# Patient Record
Sex: Male | Born: 1990 | Race: White | Hispanic: No | Marital: Single | Smoking: Former smoker
Health system: Southern US, Community
[De-identification: ages and names within clinical notes are randomized; demographics above are authoritative.]

## PROBLEM LIST (undated history)

## (undated) DIAGNOSIS — R569 Unspecified convulsions: Secondary | ICD-10-CM

## (undated) DIAGNOSIS — F8081 Childhood onset fluency disorder: Secondary | ICD-10-CM

## (undated) HISTORY — PX: NO PAST SURGERIES: SHX2092

---

## 2015-09-19 ENCOUNTER — Encounter (HOSPITAL_COMMUNITY): Payer: Self-pay | Admitting: Nurse Practitioner

## 2015-09-19 ENCOUNTER — Emergency Department (HOSPITAL_COMMUNITY)
Admission: EM | Admit: 2015-09-19 | Discharge: 2015-09-20 | Disposition: A | Discharge status: Home / Self Care | Payer: Self-pay | Attending: Emergency Medicine | Admitting: Emergency Medicine

## 2015-09-19 DIAGNOSIS — R1012 Left upper quadrant pain: Secondary | ICD-10-CM | POA: Insufficient documentation

## 2015-09-19 DIAGNOSIS — E860 Dehydration: Secondary | ICD-10-CM

## 2015-09-19 DIAGNOSIS — F17210 Nicotine dependence, cigarettes, uncomplicated: Secondary | ICD-10-CM | POA: Insufficient documentation

## 2015-09-19 HISTORY — DX: Unspecified convulsions: R56.9

## 2015-09-19 HISTORY — DX: Childhood onset fluency disorder: F80.81

## 2015-09-19 LAB — I-STAT CHEM 8, ED
BUN: 14 mg/dL (ref 6–20)
CALCIUM ION: 1.15 mmol/L (ref 1.12–1.23)
Chloride: 101 mmol/L (ref 101–111)
Creatinine, Ser: 0.9 mg/dL (ref 0.61–1.24)
Glucose, Bld: 100 mg/dL — ABNORMAL HIGH (ref 65–99)
HEMATOCRIT: 43 % (ref 39.0–52.0)
HEMOGLOBIN: 14.6 g/dL (ref 13.0–17.0)
Potassium: 4 mmol/L (ref 3.5–5.1)
SODIUM: 138 mmol/L (ref 135–145)
TCO2: 22 mmol/L (ref 0–100)

## 2015-09-19 LAB — CBC
HCT: 44.4 % (ref 39.0–52.0)
HEMOGLOBIN: 15.1 g/dL (ref 13.0–17.0)
MCH: 29.3 pg (ref 26.0–34.0)
MCHC: 34 g/dL (ref 30.0–36.0)
MCV: 86 fL (ref 78.0–100.0)
PLATELETS: 214 10*3/uL (ref 150–400)
RBC: 5.16 MIL/uL (ref 4.22–5.81)
RDW: 12.5 % (ref 11.5–15.5)
WBC: 16.1 10*3/uL — ABNORMAL HIGH (ref 4.0–10.5)

## 2015-09-19 LAB — URINALYSIS, ROUTINE W REFLEX MICROSCOPIC
BILIRUBIN URINE: NEGATIVE
Glucose, UA: NEGATIVE mg/dL
HGB URINE DIPSTICK: NEGATIVE
Ketones, ur: 40 mg/dL — AB
Leukocytes, UA: NEGATIVE
NITRITE: NEGATIVE
PROTEIN: NEGATIVE mg/dL
Specific Gravity, Urine: 1.016 (ref 1.005–1.030)
pH: 5.5 (ref 5.0–8.0)

## 2015-09-19 LAB — COMPREHENSIVE METABOLIC PANEL
ALT: 38 U/L (ref 17–63)
ANION GAP: 12 (ref 5–15)
AST: 30 U/L (ref 15–41)
Albumin: 5.2 g/dL — ABNORMAL HIGH (ref 3.5–5.0)
Alkaline Phosphatase: 70 U/L (ref 38–126)
BILIRUBIN TOTAL: 1.6 mg/dL — AB (ref 0.3–1.2)
BUN: 15 mg/dL (ref 6–20)
CALCIUM: 10.4 mg/dL — AB (ref 8.9–10.3)
CO2: 21 mmol/L — AB (ref 22–32)
CREATININE: 1.63 mg/dL — AB (ref 0.61–1.24)
Chloride: 99 mmol/L — ABNORMAL LOW (ref 101–111)
GFR calc non Af Amer: 58 mL/min — ABNORMAL LOW (ref >60)
Glucose, Bld: 88 mg/dL (ref 65–99)
Potassium: 3.9 mmol/L (ref 3.5–5.1)
Sodium: 132 mmol/L — ABNORMAL LOW (ref 135–145)
TOTAL PROTEIN: 8.1 g/dL (ref 6.5–8.1)

## 2015-09-19 MED ORDER — SODIUM CHLORIDE 0.9 % IV BOLUS (SEPSIS)
1000.0000 mL | Freq: Once | INTRAVENOUS | Status: AC
  Administered 2015-09-20: 1000 mL via INTRAVENOUS

## 2015-09-19 MED ORDER — ONDANSETRON HCL 4 MG/2ML IJ SOLN
4.0000 mg | Freq: Once | INTRAMUSCULAR | Status: DC
  Filled 2015-09-19: qty 2

## 2015-09-19 MED ORDER — SODIUM CHLORIDE 0.9 % IV BOLUS (SEPSIS)
1000.0000 mL | Freq: Once | INTRAVENOUS | Status: AC
  Administered 2015-09-19: 1000 mL via INTRAVENOUS

## 2015-09-19 NOTE — ED Notes (Signed)
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 He reports he was able to eat and drink his normal intake at work today. He is alert and breathing easily. He does have a stutter which family states is normal for him.

## 2015-09-19 NOTE — ED Notes (Signed)
Requested urine sample from pt ? ?

## 2015-09-20 LAB — CK: Total CK: 490 U/L — ABNORMAL HIGH (ref 49–397)
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
## 2015-09-20 NOTE — ED Notes (Signed)
Pt has been ambulating around room/to and from bathroom. Steady gait noted. Pt denies dizziness/lightheadedness. Pt received 2 cups water for PO challenge, pt tolerated well. Denies nausea

## 2015-09-20 NOTE — Discharge Instructions (Signed)
Dehydration, Adult °Dehydration is a condition in which you do not have enough fluid or water in your body. It happens when you take in less fluid than you lose. Vital organs such as the kidneys, brain, and heart cannot function without a proper amount of fluids. Any loss of fluids from the body can cause dehydration.  °Dehydration can range from mild to severe. This condition should be treated right away to help prevent it from becoming severe. °CAUSES  °This condition may be caused by: °· Vomiting. °· Diarrhea. °· Excessive sweating, such as when exercising in hot or humid weather. °· Not drinking enough fluid during strenuous exercise or during an illness. °· Excessive urine output. °· Fever. °· Certain medicines. °RISK FACTORS °This condition is more likely to develop in: °· People who are taking certain medicines that cause the body to lose excess fluid (diuretics).   °· People who have a chronic illness, such as diabetes, that may increase urination. °· Older adults.   °· People who live at high altitudes.   °· People who participate in endurance sports.   °SYMPTOMS  °Mild Dehydration °· Thirst. °· Dry lips. °· Slightly dry mouth. °· Dry, warm skin. °Moderate Dehydration °· Very dry mouth.   °· Muscle cramps.   °· Dark urine and decreased urine production.   °· Decreased tear production.   °· Headache.   °· Light-headedness, especially when you stand up from a sitting position.   °Severe Dehydration °· Changes in skin.   °¨ Cold and clammy skin.   °¨ Skin does not spring back quickly when lightly pinched and released.   °· Changes in body fluids.   °¨ Extreme thirst.   °¨ No tears.   °¨ Not able to sweat when body temperature is high, such as in hot weather.   °¨ Minimal urine production.   °· Changes in vital signs.   °¨ Rapid, weak pulse (more than 100 beats per minute when you are sitting still).   °¨ Rapid breathing.   °¨ Low blood pressure.   °· Other changes.   °¨ Sunken eyes.   °¨ Cold hands and feet.    °¨ Confusion. °¨ Lethargy and difficulty being awakened. °¨ Fainting (syncope).   °¨ Short-term weight loss.   °¨ Unconsciousness. °DIAGNOSIS  °This condition may be diagnosed based on your symptoms. You may also have tests to determine how severe your dehydration is. These tests may include:  °· Urine tests.   °· Blood tests.   °TREATMENT  °Treatment for this condition depends on the severity. Mild or moderate dehydration can often be treated at home. Treatment should be started right away. Do not wait until dehydration becomes severe. Severe dehydration needs to be treated at the hospital. °Treatment for Mild Dehydration °· Drinking plenty of water to replace the fluid you have lost.   °· Replacing minerals in your blood (electrolytes) that you may have lost.   °Treatment for Moderate Dehydration  °· Consuming oral rehydration solution (ORS). °Treatment for Severe Dehydration °· Receiving fluid through an IV tube.   °· Receiving electrolyte solution through a feeding tube that is passed through your nose and into your stomach (nasogastric tube or NG tube). °· Correcting any abnormalities in electrolytes. °HOME CARE INSTRUCTIONS  °· Drink enough fluid to keep your urine clear or pale yellow.   °· Drink water or fluid slowly by taking small sips. You can also try sucking on ice cubes.  °· Have food or beverages that contain electrolytes. Examples include bananas and sports drinks. °· Take over-the-counter and prescription medicines only as told by your health care provider.   °· Prepare ORS according to the manufacturer's instructions. Take sips   of ORS every 5 minutes until your urine returns to normal.  If you have vomiting or diarrhea, continue to try to drink water, ORS, or both.   If you have diarrhea, avoid:   Beverages that contain caffeine.   Fruit juice.   Milk.   Carbonated soft drinks.  Do not take salt tablets. This can lead to the condition of having too much sodium in your body  (hypernatremia).  SEEK MEDICAL CARE IF:  You cannot eat or drink without vomiting.  You have had moderate diarrhea during a period of more than 24 hours.  You have a fever. SEEK IMMEDIATE MEDICAL CARE IF:   You have extreme thirst.  You have severe diarrhea.  You have not urinated in 6-8 hours, or you have urinated only a small amount of very dark urine.  You have shriveled skin.  You are dizzy, confused, or both.   This information is not intended to replace advice given to you by your health care provide Rehydration, Adult Rehydration is the replacement of body fluids lost during dehydration. Dehydration is an extreme loss of body fluids to the point of body function impairment. There are many ways extreme fluid loss can occur, including vomiting, diarrhea, or excess sweating. Recovering from dehydration requires replacing lost fluids, continuing to eat to maintain strength, and avoiding foods and beverages that may contribute to further fluid loss or may increase nausea. HOW TO REHYDRATE In most cases, rehydration involves the replacement of not only fluids but also carbohydrates and basic body salts. Rehydration with an oral rehydration solution is one way to replace essential nutrients lost through dehydration. An oral rehydration solution can be purchased at pharmacies, retail stores, and online. Premixed packets of powder that you combine with water to make a solution are also sold. You can prepare an oral rehydration solution at home by mixing the following ingredients together:    - tsp table salt.   tsp baking soda.   tsp salt substitute containing potassium chloride.  1 tablespoons sugar.  1 L (34 oz) of water. Be sure to use exact measurements. Including too much sugar can make diarrhea worse. Drink -1 cup (120-240 mL) of oral rehydration solution each time you have diarrhea or vomit. If drinking this amount makes your vomiting worse, try drinking smaller amounts  more often. For example, drink 1-3 tsp every 5-10 minutes.  A general rule for staying hydrated is to drink 1-2 L of fluid per day. Talk to your caregiver about the specific amount you should be drinking each day. Drink enough fluids to keep your urine clear or pale yellow. EATING WHEN DEHYDRATED Even if you have had severe sweating or you are having diarrhea, do not stop eating. Many healthy items in a normal diet are okay to continue eating while recovering from dehydration. The following tips can help you to lessen nausea when you eat:  Ask someone else to prepare your food. Cooking smells may worsen nausea.  Eat in a well-ventilated room away from cooking smells.  Sit up when you eat. Avoid lying down until 1-2 hours after eating.  Eat small amounts when you eat.  Eat foods that are easy to digest. These include soft, well-cooked, or mashed foods. FOODS AND BEVERAGES TO AVOID Avoid eating or drinking the following foods and beverages that may increase nausea or further loss of fluid:   Fruit juices with a high sugar content, such as concentrated juices.  Alcohol.  Beverages containing caffeine.  Carbonated drinks.  They may cause a lot of gas.  Foods that may cause a lot of gas, such as cabbage, broccoli, and beans.  Fatty, greasy, and fried foods.  Spicy, very salty, and very sweet foods or drinks.  Foods or drinks that are very hot or very cold. Consume food or drinks at or near room temperature.  Foods that need a lot of chewing, such as raw vegetables.  Foods that are sticky or hard to swallow, such as peanut butter.   This information is not intended to replace advice given to you by your health care provider. Make sure you discuss any questions you have with your health care provider.   Document Released: 06/16/2011 Document Revised: 12/17/2011 Document Reviewed: 06/16/2011 Elsevier Interactive Patient Education 2016 [employer] r. Make sure you discuss any  questions you have with your health care provider.   Document Released: 03/24/2005 Document Revised: 12/13/2014 Document Reviewed: 08/09/2014 Elsevier Interactive Patient Education [employer]

## 2019-11-16 ENCOUNTER — Emergency Department (HOSPITAL_COMMUNITY)
Admission: EM | Admit: 2019-11-16 | Discharge: 2019-11-17 | Disposition: A | Discharge status: Home / Self Care | Payer: Self-pay | Attending: Emergency Medicine | Admitting: Emergency Medicine

## 2019-11-16 ENCOUNTER — Other Ambulatory Visit: Payer: Self-pay

## 2019-11-16 DIAGNOSIS — Y939 Activity, unspecified: Secondary | ICD-10-CM | POA: Insufficient documentation

## 2019-11-16 DIAGNOSIS — Y929 Unspecified place or not applicable: Secondary | ICD-10-CM | POA: Insufficient documentation

## 2019-11-16 DIAGNOSIS — Z5321 Procedure and treatment not carried out due to patient leaving prior to being seen by health care provider: Secondary | ICD-10-CM | POA: Insufficient documentation

## 2019-11-16 DIAGNOSIS — X19XXXA Contact with other heat and hot substances, initial encounter: Secondary | ICD-10-CM | POA: Insufficient documentation

## 2019-11-16 DIAGNOSIS — Y999 Unspecified external cause status: Secondary | ICD-10-CM | POA: Insufficient documentation

## 2019-11-16 DIAGNOSIS — S99929A Unspecified injury of unspecified foot, initial encounter: Secondary | ICD-10-CM | POA: Insufficient documentation
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
## 2019-11-16 NOTE — ED Notes (Signed)
Registration handed this NT patient labels stating patient was seen getting into car from wheelchair and leaving.

## 2020-01-20 ENCOUNTER — Encounter (HOSPITAL_COMMUNITY): Payer: Self-pay

## 2020-01-20 ENCOUNTER — Emergency Department (HOSPITAL_COMMUNITY)
Admission: EM | Admit: 2020-01-20 | Discharge: 2020-01-20 | Disposition: A | Discharge status: Home / Self Care | Payer: Self-pay | Attending: Emergency Medicine | Admitting: Emergency Medicine

## 2020-01-20 ENCOUNTER — Emergency Department (HOSPITAL_COMMUNITY): Payer: Self-pay

## 2020-01-20 ENCOUNTER — Other Ambulatory Visit: Payer: Self-pay

## 2020-01-20 DIAGNOSIS — S0990XA Unspecified injury of head, initial encounter: Secondary | ICD-10-CM

## 2020-01-20 DIAGNOSIS — F17210 Nicotine dependence, cigarettes, uncomplicated: Secondary | ICD-10-CM | POA: Insufficient documentation

## 2020-01-20 DIAGNOSIS — Z23 Encounter for immunization: Secondary | ICD-10-CM | POA: Insufficient documentation

## 2020-01-20 DIAGNOSIS — Y92410 Unspecified street and highway as the place of occurrence of the external cause: Secondary | ICD-10-CM | POA: Insufficient documentation

## 2020-01-20 DIAGNOSIS — Y9355 Activity, bike riding: Secondary | ICD-10-CM | POA: Insufficient documentation

## 2020-01-20 DIAGNOSIS — S0101XA Laceration without foreign body of scalp, initial encounter: Secondary | ICD-10-CM | POA: Insufficient documentation

## 2020-01-20 DIAGNOSIS — W19XXXA Unspecified fall, initial encounter: Secondary | ICD-10-CM

## 2020-01-20 LAB — CBC WITH DIFFERENTIAL/PLATELET
Abs Immature Granulocytes: 0.01 10*3/uL (ref 0.00–0.07)
Basophils Absolute: 0 10*3/uL (ref 0.0–0.1)
Basophils Relative: 0 %
Eosinophils Absolute: 0 10*3/uL (ref 0.0–0.5)
Eosinophils Relative: 0 %
HCT: 41.6 % (ref 39.0–52.0)
Hemoglobin: 13.6 g/dL (ref 13.0–17.0)
Immature Granulocytes: 0 %
Lymphocytes Relative: 12 %
Lymphs Abs: 1 10*3/uL (ref 0.7–4.0)
MCH: 29.5 pg (ref 26.0–34.0)
MCHC: 32.7 g/dL (ref 30.0–36.0)
MCV: 90.2 fL (ref 80.0–100.0)
Monocytes Absolute: 0.7 10*3/uL (ref 0.1–1.0)
Monocytes Relative: 8 %
Neutro Abs: 6.8 10*3/uL (ref 1.7–7.7)
Neutrophils Relative %: 80 %
Platelets: 215 10*3/uL (ref 150–400)
RBC: 4.61 MIL/uL (ref 4.22–5.81)
RDW: 12.2 % (ref 11.5–15.5)
WBC: 8.5 10*3/uL (ref 4.0–10.5)
nRBC: 0 % (ref 0.0–0.2)

## 2020-01-20 LAB — BASIC METABOLIC PANEL
Anion gap: 10 (ref 5–15)
BUN: 16 mg/dL (ref 6–20)
CO2: 25 mmol/L (ref 22–32)
Calcium: 9.5 mg/dL (ref 8.9–10.3)
Chloride: 101 mmol/L (ref 98–111)
Creatinine, Ser: 0.93 mg/dL (ref 0.61–1.24)
GFR, Estimated: >60 mL/min (ref >60)
Glucose, Bld: 114 mg/dL — ABNORMAL HIGH (ref 70–99)
Potassium: 4.5 mmol/L (ref 3.5–5.1)
Sodium: 136 mmol/L (ref 135–145)

## 2020-01-20 MED ORDER — MORPHINE SULFATE (PF) 4 MG/ML IV SOLN
4.0000 mg | Freq: Once | INTRAVENOUS | Status: AC
  Administered 2020-01-20: 4 mg via INTRAVENOUS
  Filled 2020-01-20: qty 1

## 2020-01-20 MED ORDER — FENTANYL CITRATE (PF) 100 MCG/2ML IJ SOLN
50.0000 ug | Freq: Once | INTRAMUSCULAR | Status: AC
  Administered 2020-01-20: 50 ug via INTRAVENOUS
  Filled 2020-01-20: qty 2

## 2020-01-20 MED ORDER — LIDOCAINE-EPINEPHRINE-TETRACAINE (LET) TOPICAL GEL
3.0000 mL | Freq: Once | TOPICAL | Status: AC
  Administered 2020-01-20: 3 mL via TOPICAL
  Filled 2020-01-20: qty 3

## 2020-01-20 MED ORDER — LIDOCAINE-EPINEPHRINE 1 %-1:100000 IJ SOLN: 10.0000 mL | Freq: Once | INTRAMUSCULAR | Status: DC

## 2020-01-20 MED ORDER — TETANUS-DIPHTH-ACELL PERTUSSIS 5-2.5-18.5 LF-MCG/0.5 IM SUSP
0.5000 mL | Freq: Once | INTRAMUSCULAR | Status: AC
  Administered 2020-01-20: 0.5 mL via INTRAMUSCULAR
  Filled 2020-01-20: qty 0.5

## 2020-01-20 MED ORDER — ONDANSETRON HCL 4 MG/2ML IJ SOLN
4.0000 mg | Freq: Once | INTRAMUSCULAR | Status: AC
  Administered 2020-01-20: 4 mg via INTRAVENOUS
  Filled 2020-01-20: qty 2

## 2020-01-20 NOTE — ED Notes (Signed)
Pt ambulated in hallway well/. No complaints of dizziness or lightheadedness.  
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
## 2020-01-20 NOTE — ED Notes (Signed)
Got patient into a gown on the monitor did ekg shown to the er doctor patient is resting with call bell in reach
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
## 2020-01-20 NOTE — Discharge Instructions (Addendum)
At this time there does not appear to be the presence of an emergent medical condition, however there is always the potential for conditions to change. Please read and follow the below instructions.  Please return to the Emergency Department immediately for any new or worsening symptoms. Please be sure to follow up with your Primary Care Provider within one week regarding your visit today; please call their office to schedule an appointment even if you are feeling better for a follow-up visit. Your staples will need to be removed and 7-10 days.  They may be removed by your primary care doctor, and urgent care or here at the emergency department.  If you develop any signs of infection do not wait and instead return immediately to the emergency department. Your CT scan had an incidental finding of reversal of the normal lordosis of your cervical spine, this may be due to muscle spasm.  Please discuss this result with your primary care provider at your follow-up visit.  Go to the nearest Emergency Department immediately if: You have fever or chillsYou have: A very bad headache that is not helped by medicine. Trouble walking or weakness in your arms and legs. Clear or bloody fluid coming from your nose or ears. Changes in how you see (vision). Shaking movements that you cannot control. You lose your balance. You vomit. The black centers of your eyes (pupils) change in size. Your speech is slurred. Your dizziness gets worse. You pass out. You are sleepier than normal and have trouble staying awake. You have very bad swelling around the wound. Your pain suddenly gets worse and is very bad. You notice painful lumps near the wound or anywhere on your body. You have a red streak going away from your wound. You have any new/concerning or worsening of symptoms  Please read the additional information packets attached to your discharge summary.  Do not take your medicine if  develop an itchy rash,  swelling in your mouth or lips, or difficulty breathing; call 911 and seek immediate emergency medical attention if this occurs.  You may review your lab tests and imaging results in their entirety on your MyChart account.  Please discuss all results of fully with your primary care provider and other specialist at your follow-up visit.  Note: Portions of this text may have been transcribed using voice recognition software. Every effort was made to ensure accuracy; however, inadvertent computerized transcription errors may still be present.

## 2020-01-20 NOTE — ED Provider Notes (Signed)
..  Laceration Repair  Date/Time: 01/20/2020 12:52 PM Performed by: Derrel Nip, MD Authorized by: Vanetta Mulders, MD   Consent:    Consent obtained:  Verbal   Consent given by:  Patient Anesthesia (see MAR for exact dosages):    Anesthesia method:  Topical application and local infiltration   Topical anesthetic:  LET   Local anesthetic:  Lidocaine 1% WITH epi Laceration details:    Location:  Scalp   Scalp location:  Occipital   Length (cm):  3 Repair type:    Repair type:  Simple Exploration:    Hemostasis achieved with:  Epinephrine, LET and direct pressure Treatment:    Area cleansed with:  Saline   Amount of cleaning:  Standard   Irrigation solution:  Sterile saline   Irrigation method:  Syringe Skin repair:    Repair method:  Staples   Number of staples:  4 Post-procedure details:    Dressing:  Antibiotic ointment      Derrel Nip, MD 01/20/20 1253    Vanetta Mulders, MD 01/20/20 1359

## 2021-09-25 ENCOUNTER — Ambulatory Visit (INDEPENDENT_AMBULATORY_CARE_PROVIDER_SITE_OTHER): Payer: Self-pay | Admitting: Family Medicine

## 2021-09-25 ENCOUNTER — Encounter: Payer: Self-pay | Admitting: Family Medicine

## 2021-09-25 VITALS — BP 125/87 | HR 77 | Ht 65.0 in | Wt 166.4 lb

## 2021-09-25 DIAGNOSIS — F8081 Childhood onset fluency disorder: Secondary | ICD-10-CM

## 2021-09-25 DIAGNOSIS — Z789 Other specified health status: Secondary | ICD-10-CM

## 2021-09-25 MED ORDER — SPIRONOLACTONE 100 MG PO TABS: 100.0000 mg | ORAL_TABLET | Freq: Every day | ORAL | 3 refills | Status: DC

## 2021-09-25 MED ORDER — ESTRADIOL VALERATE 20 MG/ML IM OIL
TOPICAL_OIL | INTRAMUSCULAR | 3 refills | Status: DC
Start: 2021-09-25 — End: 2022-10-31

## 2021-09-25 MED ORDER — PROGESTERONE 200 MG PO CAPS: 200.0000 mg | ORAL_CAPSULE | Freq: Every day | ORAL | 3 refills | Status: DC

## 2021-09-25 NOTE — Patient Instructions (Signed)
I will send you a note about your labs. If anything is abnormal I will call you.  I will plan on seeing you in about 6 months or so. Great to meet you.

## 2021-09-26 ENCOUNTER — Encounter: Payer: Self-pay | Admitting: Family Medicine

## 2021-09-26 DIAGNOSIS — Z789 Other specified health status: Secondary | ICD-10-CM | POA: Insufficient documentation

## 2021-09-26 DIAGNOSIS — F8081 Childhood onset fluency disorder: Secondary | ICD-10-CM | POA: Insufficient documentation

## 2021-09-26 LAB — BASIC METABOLIC PANEL
BUN/Creatinine Ratio: 14 (ref 9–20)
BUN: 10 mg/dL (ref 6–20)
CO2: 21 mmol/L (ref 20–29)
Calcium: 9.9 mg/dL (ref 8.7–10.2)
Chloride: 100 mmol/L (ref 96–106)
Creatinine, Ser: 0.71 mg/dL — ABNORMAL LOW (ref 0.76–1.27)
Glucose: 99 mg/dL (ref 70–99)
Potassium: 4.5 mmol/L (ref 3.5–5.2)
Sodium: 138 mmol/L (ref 134–144)
eGFR: 128 mL/min/{1.73_m2} (ref >59)

## 2021-09-26 LAB — TESTOSTERONE: Testosterone: 13 ng/dL — ABNORMAL LOW (ref 264–916)

## 2021-09-26 NOTE — Assessment & Plan Note (Signed)
Is pleased with current level of transition and current medication regimen so we will continue that.  Labs today.  We will follow-up gender affirming hormone therapy in 6 months unless we have other issues.

## 2021-09-30 ENCOUNTER — Encounter: Payer: Self-pay | Admitting: Family Medicine

## 2022-06-08 ENCOUNTER — Encounter (HOSPITAL_COMMUNITY): Payer: Self-pay

## 2022-06-08 ENCOUNTER — Other Ambulatory Visit: Payer: Self-pay

## 2022-06-08 ENCOUNTER — Emergency Department (HOSPITAL_COMMUNITY)
Admission: EM | Admit: 2022-06-08 | Discharge: 2022-06-08 | Disposition: A | Discharge status: Home / Self Care | Payer: Self-pay | Attending: Emergency Medicine | Admitting: Emergency Medicine

## 2022-06-08 DIAGNOSIS — F10920 Alcohol use, unspecified with intoxication, uncomplicated: Secondary | ICD-10-CM | POA: Insufficient documentation

## 2022-06-08 LAB — I-STAT BETA HCG BLOOD, ED (MC, WL, AP ONLY): I-stat hCG, quantitative: <5 m[IU]/mL (ref <5)

## 2022-06-08 LAB — CBG MONITORING, ED: Glucose-Capillary: 94 mg/dL (ref 70–99)
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
## 2022-06-08 MED ORDER — ONDANSETRON HCL 4 MG/2ML IJ SOLN
4.0000 mg | Freq: Once | INTRAMUSCULAR | Status: AC
  Administered 2022-06-08: 4 mg via INTRAVENOUS
  Filled 2022-06-08: qty 2
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
## 2022-06-08 NOTE — ED Notes (Signed)
Significant other at patient bedside.

## 2022-06-08 NOTE — Discharge Instructions (Addendum)
I have printed for some information for you about outpatient adult counseling.  Please ignore the substance abuse section.  Make sure you are hydrating, take Tylenol and ibuprofen as needed for aches and pains.  Please return the emergency room for any new or concerning symptoms or if you become concerned about your safety.

## 2022-09-18 ENCOUNTER — Encounter: Payer: Self-pay | Admitting: Family Medicine

## 2022-09-18 ENCOUNTER — Other Ambulatory Visit: Payer: Self-pay | Admitting: Family Medicine

## 2022-10-08 ENCOUNTER — Ambulatory Visit (INDEPENDENT_AMBULATORY_CARE_PROVIDER_SITE_OTHER): Payer: Medicaid Other | Admitting: Family Medicine

## 2022-10-08 ENCOUNTER — Encounter: Payer: Self-pay | Admitting: Family Medicine

## 2022-10-08 ENCOUNTER — Ambulatory Visit: Payer: Self-pay | Admitting: Family Medicine

## 2022-10-08 VITALS — BP 102/68 | HR 68 | Ht 65.0 in | Wt 138.0 lb

## 2022-10-08 DIAGNOSIS — F8081 Childhood onset fluency disorder: Secondary | ICD-10-CM

## 2022-10-08 DIAGNOSIS — Z5982 Transportation insecurity: Secondary | ICD-10-CM | POA: Diagnosis not present

## 2022-10-08 DIAGNOSIS — Z789 Other specified health status: Secondary | ICD-10-CM | POA: Diagnosis not present
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
## 2022-10-08 NOTE — Patient Instructions (Signed)
Therapy and Counseling Resources Most providers on this list will take Medicaid. Patients with commercial insurance or Medicare should contact their insurance company to get a list of in network providers.  [facility] (takes children) Location 1: [address] [place], [place] [address] Location 2: [address] [place], [place] [address] [phone]     [facility] [address] [place] [place] [employer] [place], [place] [address] [phone]  [facility]   [address] Suite [place], [place] [address]      [phone]  [facility] (spanish available) [address]  [place], [place] [address] [phone]  [facility] (take [facility] and medicare) [address], [address]  [place], [place] [address]       [phone]     [facility] (virtual only) [phone]  [facility] [address] [place], [place] [phone]  [facility]:  [address] [place] [phone]  [facility]:  [address] [place] Diener [phone]  [facility] (under & uninsured) [address]   [place] [phone]    [email or web address]     [facility] [phone] [address]  [place]    [phone]  [facility] -[address]     Phone:  [phone]     [facility]  [address]  [phone]   [facility] [address]      [place], [place] [phone]  [facility]: [address] [place], [place], [place]  [phone]   [facility] (Spanish therapist) [email or web address]  [address] [place]  [address]   [place] [address]    [phone]    [facility]   [address],  [place], [place]  [phone]   [facility]  [address] [place]   [phone]  [facility]  [address] [place], [place]        [phone]  Open Access/Walk In Clinic under & uninsured  [facility]  [address] [place], [place] [phone] Crisis [phone]  [facility],  (Spanish)   [address], [place]: [phone]) 8:30 - 12; 1 - 2:30  [facility] [employer],  [address] [place], [place]    ([phone]):8:30 - 12; 2 - 3PM  [facility] [employer],  [address],  [place]; [phone]):   Mon - Fri 8 AM - 5 PM  [facility] [address] [place]  MWF 12:30 to 3:00 or call to schedule an appointment  [phone]  Specific Provider options Psychology Today  [email or web address] click on find a therapist  enter your zip code left side and select or tailor a therapist for your specific need.   [facility] Provider Directory [email or web address]  (Medicaid)   Follow all drop down to find a provider  Social Support program [facility] Joice [phone] or Photo[employer] [address], [place], [place] Recovery support and educational   24- Hour Availability:   [facility]  [address] [place], [place] [phone] Crisis [phone]  [facility] [phone]  [place] [facility]  [phone]   [facility]  [phone] (after hours)  [facility]/Mobile Crisis  [phone]  [place] National Suicide Hotline  [phone] (TALK)  Call 911 or go to emergency room  [facility]  [phone]);  [place] and [employer]  [phone]); [place], [place], [place], [place], [place], [place], [place]

## 2022-10-09 LAB — BASIC METABOLIC PANEL
BUN/Creatinine Ratio: 21 — ABNORMAL HIGH (ref 9–20)
BUN: 14 mg/dL (ref 6–20)
CO2: 22 mmol/L (ref 20–29)
Calcium: 9 mg/dL (ref 8.7–10.2)
Chloride: 100 mmol/L (ref 96–106)
Creatinine, Ser: 0.67 mg/dL — ABNORMAL LOW (ref 0.76–1.27)
Glucose: 83 mg/dL (ref 70–99)
Potassium: 4.4 mmol/L (ref 3.5–5.2)
Sodium: 137 mmol/L (ref 134–144)
eGFR: 129 mL/min/{1.73_m2} (ref >59)

## 2022-10-10 ENCOUNTER — Encounter: Payer: Self-pay | Admitting: Family Medicine

## 2022-10-10 DIAGNOSIS — Z5982 Transportation insecurity: Secondary | ICD-10-CM | POA: Insufficient documentation

## 2022-10-10 NOTE — Assessment & Plan Note (Signed)
Currently happy with transition.  Will check labs today.  Continue current medications and have sent in refills.  No dose changes.  Do not think we need to recheck testosterone as it was quite low last time and there have been no medication changes.

## 2022-10-10 NOTE — Assessment & Plan Note (Signed)
First time we have discussed this.  She is severely limited by this.  I will investigate getting her in touch with a speech therapist we will take her insurance.  I will MyChart her that message.

## 2022-10-10 NOTE — Assessment & Plan Note (Signed)
I will explore some options for her.  Did not place chronic care management referral today as I do not want to overwhelm her lower also trying to get her set up with therapy and in consideration of the fact that phone calls are quite difficult for her.  I will follow this up via MyChart.

## 2022-10-13 ENCOUNTER — Other Ambulatory Visit: Payer: Self-pay | Admitting: Family Medicine

## 2022-10-13 ENCOUNTER — Encounter: Payer: Self-pay | Admitting: Family Medicine

## 2022-10-13 DIAGNOSIS — F8081 Childhood onset fluency disorder: Secondary | ICD-10-CM

## 2022-10-13 DIAGNOSIS — Z789 Other specified health status: Secondary | ICD-10-CM

## 2022-10-30 ENCOUNTER — Other Ambulatory Visit: Payer: Self-pay | Admitting: Family Medicine

## 2022-10-31 ENCOUNTER — Other Ambulatory Visit: Payer: Self-pay | Admitting: Family Medicine

## 2022-10-31 MED ORDER — SPIRONOLACTONE 100 MG PO TABS: 100.0000 mg | ORAL_TABLET | Freq: Every day | ORAL | 0 refills | Status: DC

## 2022-10-31 MED ORDER — PROGESTERONE 200 MG PO CAPS: 200.0000 mg | ORAL_CAPSULE | Freq: Every day | ORAL | 0 refills | Status: DC

## 2022-12-26 ENCOUNTER — Other Ambulatory Visit: Payer: Self-pay | Admitting: Family Medicine

## 2022-12-26 MED ORDER — PROGESTERONE 200 MG PO CAPS: 200.0000 mg | ORAL_CAPSULE | Freq: Every day | ORAL | 0 refills | Status: DC

## 2022-12-26 MED ORDER — SPIRONOLACTONE 100 MG PO TABS: 100.0000 mg | ORAL_TABLET | Freq: Every day | ORAL | 0 refills | Status: DC

## 2023-02-26 ENCOUNTER — Encounter: Payer: Self-pay | Admitting: Family Medicine

## 2023-05-14 ENCOUNTER — Encounter (HOSPITAL_COMMUNITY): Payer: Self-pay

## 2023-05-14 ENCOUNTER — Ambulatory Visit (HOSPITAL_COMMUNITY)
Admission: EM | Admit: 2023-05-14 | Discharge: 2023-05-14 | Disposition: A | Discharge status: Home / Self Care | Payer: Medicaid Other | Attending: Physician Assistant | Admitting: Physician Assistant

## 2023-05-14 DIAGNOSIS — J039 Acute tonsillitis, unspecified: Secondary | ICD-10-CM | POA: Diagnosis not present

## 2023-05-14 LAB — POCT MONO SCREEN (KUC): Mono, POC: NEGATIVE

## 2023-05-14 LAB — POCT RAPID STREP A (OFFICE): Rapid Strep A Screen: NEGATIVE

## 2023-05-14 MED ORDER — AMOXICILLIN-POT CLAVULANATE 875-125 MG PO TABS: 1.0000 | ORAL_TABLET | Freq: Two times a day (BID) | ORAL | 0 refills | Status: DC

## 2023-05-14 MED ORDER — DEXAMETHASONE SODIUM PHOSPHATE 10 MG/ML IJ SOLN
10.0000 mg | Freq: Once | INTRAMUSCULAR | Status: AC
  Administered 2023-05-14: 10 mg via INTRAMUSCULAR

## 2023-05-14 MED ORDER — DEXAMETHASONE SODIUM PHOSPHATE 10 MG/ML IJ SOLN
INTRAMUSCULAR | Status: AC
  Filled 2023-05-14: qty 1

## 2023-05-14 NOTE — Discharge Instructions (Signed)
 You tested negative for strep and for mono.  We are going to treat for an infection of your tonsils.  Start Augmentin  twice daily for 10 days.  Gargle with warm salt water and use Tylenol /ibuprofen to help with your pain.  We gave you an injection of steroids today that should help with the swelling and discomfort.  If your symptoms are not improving within a day or 2 of starting the medication or if anything worsens and you have high fever, difficulty swallowing, swelling of her throat, shortness of breath, muffled voice you need to go to the emergency room.  Follow-up with your primary care tomorrow if symptoms have not improved.

## 2023-05-14 NOTE — ED Triage Notes (Signed)
 Pt presents with c/o swollen tonsils x 2 days, states she cannot swallow water and reports her tonsils are covered in white spots

## 2023-05-14 NOTE — ED Provider Notes (Signed)
 [facility]    CSN: [id number] Arrival date & time: 05/14/23  0810      History   Chief Complaint Chief Complaint  Patient presents with   Sore Throat    HPI Jesus Wilson is a 33 y.o. adult.   Patient presents today with a 2-day history of sore throat/swelling of tonsils.  Reports pain is rated 6 on a 0-10 pain scale, described as sharp, worse with swallowing, no alleviating factors identified.  She denies any fever, cough, congestion, nausea, vomiting.  She has tried Tylenol  with her last dose earlier this morning.  She denies any recent antibiotics.  She does report sick contacts at work but does not know what they were diagnosed with.  Denies any recent antibiotics or steroids.  Denies history of diabetes.  She is having difficulty swallowing her saliva and water because of the pain but reports that she is able to do so it is just incredibly painful.    Past Medical History:  Diagnosis Date   Seizures [facility])    Stutter     Patient Active Problem List   Diagnosis Date Noted   Transportation insecurity 10/10/2022   Male-to-male transgender person 09/26/2021   Stuttering 09/26/2021    History reviewed. No pertinent surgical history.     Home Medications    Prior to Admission medications   Medication Sig Start Date End Date Taking? Authorizing Provider  amoxicillin -clavulanate (AUGMENTIN ) 875-125 MG tablet Take 1 tablet by mouth every 12 (twelve) hours. 05/14/23  Yes Emerita Berkemeier K, PA-C  estradiol  valerate (DELESTROGEN ) 20 MG/ML injection INJECT 0.3 MILLILITERS INTO THIGH EVERY WEEK AS DIRECTED BY DOCTOR 10/31/22   Rosalynn Camie CROME, MD  progesterone  (PROMETRIUM ) 200 MG capsule Take 1 capsule (200 mg total) by mouth daily. 12/26/22   Rosalynn Camie CROME, MD  spironolactone  (ALDACTONE ) 100 MG tablet Take 1 tablet (100 mg total) by mouth daily. 12/26/22   Rosalynn Camie CROME, MD    Family History History reviewed. No pertinent family history.  Social History Social History    Tobacco Use   Smoking status: Every Day    Types: E-cigarettes   Tobacco comments:    Vaping only; no longer smoking cigarettes  Vaping Use   Vaping status: Every Day  Substance Use Topics   Alcohol use: Yes   Drug use: No     Allergies   Patient has no known allergies.   Review of Systems Review of Systems  Constitutional:  Positive for activity change. Negative for appetite change, fatigue and fever.  HENT:  Positive for sore throat and trouble swallowing. Negative for congestion, sinus pressure, sneezing and voice change.   Respiratory:  Negative for cough and shortness of breath.   Cardiovascular:  Negative for chest pain.  Gastrointestinal:  Negative for abdominal pain, diarrhea, nausea and vomiting.  Neurological:  Negative for dizziness, light-headedness and headaches.     Physical Exam Triage Vital Signs ED Triage Vitals  Encounter Vitals Group     BP 05/14/23 0848 106/69     Systolic BP Percentile --      Diastolic BP Percentile --      Pulse Rate 05/14/23 0848 85     Resp 05/14/23 0848 18     Temp 05/14/23 0848 98.7 F (37.1 C)     Temp Source 05/14/23 0848 Oral     SpO2 05/14/23 0848 98 %     Weight --      Height --  Head Circumference --      Peak Flow --      Pain Score 05/14/23 0847 6     Pain Loc --      Pain Education --      Exclude from Growth Chart --    No data found.  Updated Vital Signs BP 106/69 (BP Location: Left Arm)   Pulse 85   Temp 98.7 F (37.1 C) (Oral)   Resp 18   SpO2 98%   Visual Acuity Right Eye Distance:   Left Eye Distance:   Bilateral Distance:    Right Eye Near:   Left Eye Near:    Bilateral Near:     Physical Exam Vitals reviewed.  Constitutional:      General: She is awake. She is not in acute distress.    Appearance: Normal appearance. She is well-developed. She is not ill-appearing.     Comments: Very pleasant male appears stated age in no acute distress sitting comfortably in exam room   HENT:     Head: Normocephalic and atraumatic.     Right Ear: Tympanic membrane, ear canal and external ear normal. Tympanic membrane is not erythematous or bulging.     Left Ear: Tympanic membrane, ear canal and external ear normal. Tympanic membrane is not erythematous or bulging.     Nose:     Right Sinus: No maxillary sinus tenderness or frontal sinus tenderness.     Left Sinus: No maxillary sinus tenderness or frontal sinus tenderness.     Mouth/Throat:     Pharynx: Uvula midline. Posterior oropharyngeal erythema present. No oropharyngeal exudate.     Tonsils: Tonsillar exudate present. No tonsillar abscesses. 2+ on the right. 2+ on the left.  Cardiovascular:     Rate and Rhythm: Normal rate and regular rhythm.     Heart sounds: Normal heart sounds, S1 normal and S2 normal. No murmur heard. Pulmonary:     Effort: Pulmonary effort is normal.     Breath sounds: Normal breath sounds. No wheezing, rhonchi or rales.     Comments: Clear to auscultation bilaterally Psychiatric:        Behavior: Behavior is cooperative.      UC Treatments / Results  Labs (all labs ordered are listed, but only abnormal results are displayed) Labs Reviewed  POCT RAPID STREP A (OFFICE) - Normal  POCT MONO SCREEN ([facility]) - Normal    EKG   Radiology No results found.  Procedures Procedures (including critical care time)  Medications Ordered in UC Medications  dexamethasone  (DECADRON ) injection 10 mg (10 mg Intramuscular Given 05/14/23 0931)    Initial Impression / Assessment and Plan / UC Course  I have reviewed the triage vital signs and the nursing notes.  Pertinent labs & imaging results that were available during my care of the patient were reviewed by me and considered in my medical decision making (see chart for details).     Patient is well-appearing, afebrile, nontoxic, nontachycardic.  Viral testing was deferred as she denied any cough/congestion symptoms.  She had significant  exudate and swelling of her tonsils but tested negative for strep.  She was also negative for mono.  Given her clinical appearance we will initiate Augmentin  twice daily for 10 days.  We discussed that if she is falsely negative for mono based on her testing today it is possible she would develop a rash and she should stop the medication and be seen immediately.  She is to gargle with warm salt  water and use over-the-counter medications such as Tylenol  and ibuprofen for pain.  She was given 10 mg of dexamethasone  in clinic to help with swelling of tonsils.  We discussed that if her symptoms are not improving quickly she should follow-up with either our clinic or primary care.  If anything worsens and she has swelling of her throat, shortness of breath, muffled voice, high fever she needs to be seen emergently.  Strict return precautions given.  Work excuse note provided.  Final Clinical Impressions(s) / UC Diagnoses   Final diagnoses:  Acute tonsillitis, unspecified etiology     Discharge Instructions      You tested negative for strep and for mono.  We are going to treat for an infection of your tonsils.  Start Augmentin  twice daily for 10 days.  Gargle with warm salt water and use Tylenol /ibuprofen to help with your pain.  We gave you an injection of steroids today that should help with the swelling and discomfort.  If your symptoms are not improving within a day or 2 of starting the medication or if anything worsens and you have high fever, difficulty swallowing, swelling of her throat, shortness of breath, muffled voice you need to go to the emergency room.  Follow-up with your primary care tomorrow if symptoms have not improved.     ED Prescriptions     Medication Sig Dispense Auth. Provider   amoxicillin -clavulanate (AUGMENTIN ) 875-125 MG tablet Take 1 tablet by mouth every 12 (twelve) hours. 20 tablet Carinne Brandenburger K, PA-C      PDMP not reviewed this encounter.   Sherrell Rocky POUR,  PA-C 05/14/23 1009

## 2023-05-15 ENCOUNTER — Other Ambulatory Visit: Payer: Self-pay

## 2023-05-15 ENCOUNTER — Ambulatory Visit (HOSPITAL_COMMUNITY)
Admission: EM | Admit: 2023-05-15 | Discharge: 2023-05-15 | Disposition: A | Discharge status: Home / Self Care | Payer: Medicaid Other | Attending: Emergency Medicine | Admitting: Emergency Medicine

## 2023-05-15 ENCOUNTER — Encounter (HOSPITAL_COMMUNITY): Payer: Self-pay | Admitting: *Deleted

## 2023-05-15 ENCOUNTER — Emergency Department (HOSPITAL_COMMUNITY)
Admission: EM | Admit: 2023-05-15 | Discharge: 2023-05-15 | Discharge status: Against Medical Advice | Payer: Medicaid Other | Attending: Emergency Medicine | Admitting: Emergency Medicine

## 2023-05-15 DIAGNOSIS — Z20822 Contact with and (suspected) exposure to covid-19: Secondary | ICD-10-CM | POA: Diagnosis not present

## 2023-05-15 DIAGNOSIS — R509 Fever, unspecified: Secondary | ICD-10-CM | POA: Diagnosis present

## 2023-05-15 DIAGNOSIS — J039 Acute tonsillitis, unspecified: Secondary | ICD-10-CM | POA: Diagnosis not present

## 2023-05-15 DIAGNOSIS — R0602 Shortness of breath: Secondary | ICD-10-CM | POA: Insufficient documentation

## 2023-05-15 DIAGNOSIS — Z5321 Procedure and treatment not carried out due to patient leaving prior to being seen by health care provider: Secondary | ICD-10-CM | POA: Diagnosis not present

## 2023-05-15 DIAGNOSIS — J029 Acute pharyngitis, unspecified: Secondary | ICD-10-CM

## 2023-05-15 LAB — RESP PANEL BY RT-PCR (RSV, FLU A&B, COVID)  RVPGX2
Influenza A by PCR: NEGATIVE
Influenza B by PCR: NEGATIVE
Resp Syncytial Virus by PCR: NEGATIVE
SARS Coronavirus 2 by RT PCR: NEGATIVE

## 2023-05-15 LAB — GROUP A STREP BY PCR: Group A Strep by PCR: NOT DETECTED

## 2023-05-15 MED ORDER — LIDOCAINE VISCOUS HCL 2 % MT SOLN: 15.0000 mL | OROMUCOSAL | 0 refills | Status: DC | PRN

## 2023-05-15 MED ORDER — DEXAMETHASONE 4 MG PO TABS: 10.0000 mg | ORAL_TABLET | Freq: Once | ORAL | Status: DC

## 2023-05-15 MED ORDER — ACETAMINOPHEN 325 MG PO TABS
650.0000 mg | ORAL_TABLET | Freq: Once | ORAL | Status: AC | PRN
  Administered 2023-05-15: 650 mg via ORAL
  Filled 2023-05-15: qty 2

## 2023-05-15 NOTE — ED Triage Notes (Signed)
 PT present with swollen tonsil x 3 days. Went to UC yesterday and tested negative for strep and mono. Was told to come to ER if pain worsened.  Pt states unable to swallow water.

## 2023-05-15 NOTE — ED Notes (Signed)
Pt states unable to swallow pill

## 2023-05-15 NOTE — ED Provider Notes (Signed)
 [facility]    CSN: [id number] Arrival date & time: 05/15/23  1920      History   Chief Complaint Chief Complaint  Patient presents with   Sore Throat    HPI Jesus Wilson is a 33 y.o. adult.   Patient presents with persistent sore throat and difficulty swallowing due to pain after being seen here yesterday and treated for tonsillitis. Patient reports they just started taking antibiotic today.   Sore Throat    Past Medical History:  Diagnosis Date   Seizures [facility])    Stutter     Patient Active Problem List   Diagnosis Date Noted   Transportation insecurity 10/10/2022   Male-to-male transgender person 09/26/2021   Stuttering 09/26/2021    History reviewed. No pertinent surgical history.     Home Medications    Prior to Admission medications   Medication Sig Start Date End Date Taking? Authorizing Provider  amoxicillin -clavulanate (AUGMENTIN ) 875-125 MG tablet Take 1 tablet by mouth every 12 (twelve) hours. 05/14/23  Yes Raspet, Erin K, PA-C  estradiol  valerate (DELESTROGEN ) 20 MG/ML injection INJECT 0.3 MILLILITERS INTO THIGH EVERY WEEK AS DIRECTED BY DOCTOR 10/31/22  Yes Rosalynn Camie CROME, MD  lidocaine  (XYLOCAINE ) 2 % solution Use as directed 15 mLs in the mouth or throat as needed for mouth pain. 05/15/23  Yes Johnie, Clover Feehan A, NP  progesterone  (PROMETRIUM ) 200 MG capsule Take 1 capsule (200 mg total) by mouth daily. 12/26/22  Yes Rosalynn Camie CROME, MD  spironolactone  (ALDACTONE ) 100 MG tablet Take 1 tablet (100 mg total) by mouth daily. 12/26/22  Yes Rosalynn Camie CROME, MD    Family History History reviewed. No pertinent family history.  Social History Social History   Tobacco Use   Smoking status: Every Day    Types: E-cigarettes   Tobacco comments:    Vaping only; no longer smoking cigarettes  Vaping Use   Vaping status: Every Day  Substance Use Topics   Alcohol use: Yes   Drug use: No     Allergies   Patient has no known allergies.   Review of  Systems Review of Systems  HENT:  Positive for sore throat and trouble swallowing.      Physical Exam Triage Vital Signs ED Triage Vitals  Encounter Vitals Group     BP 05/15/23 2004 112/70     Systolic BP Percentile --      Diastolic BP Percentile --      Pulse Rate 05/15/23 2004 96     Resp 05/15/23 2004 18     Temp 05/15/23 2004 98.7 F (37.1 C)     Temp Source 05/15/23 2004 Oral     SpO2 05/15/23 2004 97 %     Weight --      Height --      Head Circumference --      Peak Flow --      Pain Score 05/15/23 2003 10     Pain Loc --      Pain Education --      Exclude from Growth Chart --    No data found.  Updated Vital Signs BP 112/70 (BP Location: Left Arm)   Pulse 96   Temp 98.7 F (37.1 C) (Oral)   Resp 18   SpO2 97%   Visual Acuity Right Eye Distance:   Left Eye Distance:   Bilateral Distance:    Right Eye Near:   Left Eye Near:    Bilateral Near:  Physical Exam Vitals and nursing note reviewed.  Constitutional:      General: She is not in acute distress.    Appearance: She is well-developed. She is not ill-appearing.  HENT:     Mouth/Throat:     Mouth: Mucous membranes are moist.     Pharynx: Oropharyngeal exudate and posterior oropharyngeal erythema present.     Tonsils: Tonsillar exudate present. 1+ on the right. 1+ on the left.  Neurological:     Mental Status: She is alert.      UC Treatments / Results  Labs (all labs ordered are listed, but only abnormal results are displayed) Labs Reviewed - No data to display  EKG   Radiology No results found.  Procedures Procedures (including critical care time)  Medications Ordered in UC Medications - No data to display  Initial Impression / Assessment and Plan / UC Course  I have reviewed the triage vital signs and the nursing notes.  Pertinent labs & imaging results that were available during my care of the patient were reviewed by me and considered in my medical decision making  (see chart for details).     Patient presented with persistent sore throat and difficulty swallowing due to pain after being seen here yesterday and treated for tonsillitis.   Upon assessment there is oropharyngeal and tonsillar exudate and erythema noted with +1 swelling to bilateral tonsils.   Prescribed lidocaine  for sore throat. Recommended continuing Augmentin  until finished. Discussed return precautions.  Final Clinical Impressions(s) / UC Diagnoses   Final diagnoses:  Tonsillitis  Sore throat     Discharge Instructions      Continue taking your antibiotic until it is finished. I have prescribed lidocaine  that you can gargle and spit to help with throat discomfort. Otherwise alternate between Tylenol  and Ibuprofen as needed for pain. Return here as needed.    ED Prescriptions     Medication Sig Dispense Auth. Provider   lidocaine  (XYLOCAINE ) 2 % solution Use as directed 15 mLs in the mouth or throat as needed for mouth pain. 100 mL Johnie Flaming A, NP      PDMP not reviewed this encounter.   Johnie Flaming A, NP 05/15/23 2019

## 2023-05-15 NOTE — ED Provider Triage Note (Cosign Needed)
 Emergency Medicine Provider Triage Evaluation Note  Jesus Wilson , a 33 y.o. adult  was evaluated in triage.  Pt complains of Sore throat.  Review of Systems  Positive: Pain, swelling, redness, fever, chill, nausea, diarrhea, lymph node pain Negative: Emesis, CP, SHOB  Physical Exam  BP 102/87   Pulse (!) 109   Temp (!) 101.5 F (38.6 C) (Oral)   Resp 20   SpO2 94%  Gen:   Awake, no distress   Resp:  Normal effort  MSK:   Moves extremities without difficulty  Other:  Tonsillar exudate, redness, swelling  Medical Decision Making  Medically screening exam initiated at 5:25 PM.  Appropriate orders placed.  Diquan Kassis was informed that the remainder of the evaluation will be completed by another provider, this initial triage assessment does not replace that evaluation, and the importance of remaining in the ED until their evaluation is complete.  Labs and medication ordered   Jamell Opfer N, PA-C 05/15/23 1726

## 2023-05-15 NOTE — ED Triage Notes (Signed)
 Pt states no better than yesterday when seen. States they went to ED today but the wait was long. They were given tylenol  but took to long to get any testing or be seen.    States throat is so sore can't even swallow. They have only drank a couple of sips of diet pepsi today due ot the burning and pain.   They took Augmentin  today at 6am.

## 2023-05-15 NOTE — Discharge Instructions (Signed)
 Continue taking your antibiotic until it is finished. I have prescribed lidocaine  that you can gargle and spit to help with throat discomfort. Otherwise alternate between Tylenol  and Ibuprofen as needed for pain. Return here as needed.

## 2023-05-16 ENCOUNTER — Encounter (HOSPITAL_BASED_OUTPATIENT_CLINIC_OR_DEPARTMENT_OTHER): Payer: Self-pay

## 2023-05-16 ENCOUNTER — Emergency Department (HOSPITAL_BASED_OUTPATIENT_CLINIC_OR_DEPARTMENT_OTHER)
Admission: EM | Admit: 2023-05-16 | Discharge: 2023-05-16 | Disposition: A | Discharge status: Home / Self Care | Payer: Medicaid Other | Attending: Emergency Medicine | Admitting: Emergency Medicine

## 2023-05-16 ENCOUNTER — Other Ambulatory Visit: Payer: Self-pay

## 2023-05-16 ENCOUNTER — Other Ambulatory Visit (HOSPITAL_BASED_OUTPATIENT_CLINIC_OR_DEPARTMENT_OTHER): Payer: Self-pay

## 2023-05-16 DIAGNOSIS — Z79899 Other long term (current) drug therapy: Secondary | ICD-10-CM | POA: Insufficient documentation

## 2023-05-16 DIAGNOSIS — J029 Acute pharyngitis, unspecified: Secondary | ICD-10-CM | POA: Diagnosis present

## 2023-05-16 LAB — CBC WITH DIFFERENTIAL/PLATELET
Abs Immature Granulocytes: 0.06 10*3/uL (ref 0.00–0.07)
Basophils Absolute: 0 10*3/uL (ref 0.0–0.1)
Basophils Relative: 0 %
Eosinophils Absolute: 0 10*3/uL (ref 0.0–0.5)
Eosinophils Relative: 0 %
HCT: 39.6 % (ref 39.0–52.0)
Hemoglobin: 13.4 g/dL (ref 13.0–17.0)
Immature Granulocytes: 0 %
Lymphocytes Relative: 7 %
Lymphs Abs: 1.1 10*3/uL (ref 0.7–4.0)
MCH: 31.3 pg (ref 26.0–34.0)
MCHC: 33.8 g/dL (ref 30.0–36.0)
MCV: 92.5 fL (ref 80.0–100.0)
Monocytes Absolute: 1.5 10*3/uL — ABNORMAL HIGH (ref 0.1–1.0)
Monocytes Relative: 10 %
Neutro Abs: 12.5 10*3/uL — ABNORMAL HIGH (ref 1.7–7.7)
Neutrophils Relative %: 83 %
Platelets: 233 10*3/uL (ref 150–400)
RBC: 4.28 MIL/uL (ref 4.22–5.81)
RDW: 13.1 % (ref 11.5–15.5)
WBC: 15.3 10*3/uL — ABNORMAL HIGH (ref 4.0–10.5)
nRBC: 0 % (ref 0.0–0.2)

## 2023-05-16 LAB — COMPREHENSIVE METABOLIC PANEL
ALT: 29 U/L (ref 0–44)
AST: 21 U/L (ref 15–41)
Albumin: 4.1 g/dL (ref 3.5–5.0)
Alkaline Phosphatase: 40 U/L (ref 38–126)
Anion gap: 10 (ref 5–15)
BUN: 10 mg/dL (ref 6–20)
CO2: 24 mmol/L (ref 22–32)
Calcium: 9.2 mg/dL (ref 8.9–10.3)
Chloride: 98 mmol/L (ref 98–111)
Creatinine, Ser: 0.62 mg/dL (ref 0.61–1.24)
GFR, Estimated: >60 mL/min (ref >60)
Glucose, Bld: 100 mg/dL — ABNORMAL HIGH (ref 70–99)
Potassium: 3.8 mmol/L (ref 3.5–5.1)
Sodium: 132 mmol/L — ABNORMAL LOW (ref 135–145)
Total Bilirubin: 0.5 mg/dL (ref 0.0–1.2)
Total Protein: 7.7 g/dL (ref 6.5–8.1)

## 2023-05-16 MED ORDER — DEXAMETHASONE SODIUM PHOSPHATE 10 MG/ML IJ SOLN
10.0000 mg | Freq: Once | INTRAMUSCULAR | Status: AC
  Administered 2023-05-16: 10 mg via INTRAMUSCULAR
  Filled 2023-05-16: qty 1

## 2023-05-16 MED ORDER — PREDNISONE 10 MG PO TABS
20.0000 mg | ORAL_TABLET | Freq: Every day | ORAL | 0 refills | Status: AC
  Filled 2023-05-16: qty 10, 5d supply, fill #0

## 2023-05-16 MED ORDER — CLINDAMYCIN HCL 300 MG PO CAPS
300.0000 mg | ORAL_CAPSULE | Freq: Three times a day (TID) | ORAL | 0 refills | Status: AC
  Filled 2023-05-16: qty 30, 10d supply, fill #0

## 2023-05-16 NOTE — ED Provider Notes (Signed)
 [place] EMERGENCY DEPARTMENT AT [facility] Provider Note   CSN: [id number] Arrival date & time: 05/16/23  1028     History  Chief Complaint  Patient presents with   Sore Throat    Jesus Wilson is Wilson 33 y.o. adult.  Patient here with ongoing throat pain.  Been on Augmentin  for 2 days got Wilson dose of steroids early.  Still having pain but able to drink and eat.  No difficulty opening mouth or drooling.  Denies any fever or chills.  No major change in voice.  No issues moving neck.  Still very uncomfortable when drinks or eats however.  Denies any other major medical problems.  No chest pain shortness of breath  The history is provided by the patient.       Home Medications Prior to Admission medications   Medication Sig Start Date End Date Taking? Authorizing Provider  clindamycin  (CLEOCIN ) 300 MG capsule Take 1 capsule (300 mg total) by mouth 3 (three) times daily for 10 days. 05/16/23 05/26/23 Yes Jesus Rudder, DO  predniSONE  (DELTASONE ) 10 MG tablet Take 2 tablets (20 mg total) by mouth daily for 5 days. 05/16/23 05/21/23 Yes Jesus Lohmeyer, DO  amoxicillin -clavulanate (AUGMENTIN ) 875-125 MG tablet Take 1 tablet by mouth every 12 (twelve) hours. 05/14/23   Wilson, Jesus K, PA-C  estradiol  valerate (DELESTROGEN ) 20 MG/ML injection INJECT 0.3 MILLILITERS INTO THIGH EVERY WEEK AS DIRECTED BY DOCTOR 10/31/22   Jesus Wilson  lidocaine  (XYLOCAINE ) 2 % solution Use as directed 15 mLs in the mouth or throat as needed for mouth pain. 05/15/23   Jesus Wilson A, NP  progesterone  (PROMETRIUM ) 200 MG capsule Take 1 capsule (200 mg total) by mouth daily. 12/26/22   Jesus Wilson  spironolactone  (ALDACTONE ) 100 MG tablet Take 1 tablet (100 mg total) by mouth daily. 12/26/22   Jesus Wilson      Allergies    Patient has no known allergies.    Review of Systems   Review of Systems  Physical Exam Updated Vital Signs BP 114/76 (BP Location: Left Arm)   Pulse 97   Temp 98.4 F  (36.9 C) (Oral)   Resp 18   SpO2 100%  Physical Exam Vitals and nursing note reviewed.  Constitutional:      General: She is not in acute distress.    Appearance: She is well-developed.  HENT:     Head: Normocephalic and atraumatic.     Right Ear: Tympanic membrane normal.     Left Ear: Tympanic membrane normal.     Nose: Congestion present. No rhinorrhea.     Mouth/Throat:     Mouth: Mucous membranes are moist.     Pharynx: Oropharyngeal exudate and posterior oropharyngeal erythema present.     Tonsils: Tonsillar exudate present. No tonsillar abscesses. 1+ on the right. 1+ on the left.     Comments: Overall there are exudates of both tonsils but there is no abscess no trismus no drooling no submandibular swelling no change in voice, no obvious deep space infectious process, has normal mouth opening Eyes:     Conjunctiva/sclera: Conjunctivae normal.  Cardiovascular:     Rate and Rhythm: Normal rate and regular rhythm.     Heart sounds: No murmur heard. Pulmonary:     Effort: Pulmonary effort is normal. No respiratory distress.     Breath sounds: Normal breath sounds.  Abdominal:     Palpations: Abdomen is soft.     Tenderness: There  is no abdominal tenderness.  Musculoskeletal:        General: No swelling.     Cervical back: Neck supple.  Skin:    General: Skin is warm and dry.     Capillary Refill: Capillary refill takes less than 2 seconds.  Neurological:     Mental Status: She is alert.  Psychiatric:        Mood and Affect: Mood normal.     ED Results / Procedures / Treatments   Labs (all labs ordered are listed, but only abnormal results are displayed) Labs Reviewed  CBC WITH DIFFERENTIAL/PLATELET - Abnormal; Notable for the following components:      Result Value   WBC 15.3 (*)    Neutro Abs 12.5 (*)    Monocytes Absolute 1.5 (*)    All other components within normal limits  COMPREHENSIVE METABOLIC PANEL - Abnormal; Notable for the following components:    Sodium 132 (*)    Glucose, Bld 100 (*)    All other components within normal limits  GC/CHLAMYDIA PROBE AMP ([place]) NOT AT [facility]    EKG None  Radiology No results found.  Procedures Procedures    Medications Ordered in ED Medications  dexamethasone  (DECADRON ) injection 10 mg (10 mg Intramuscular Given 05/16/23 1211)    ED Course/ Medical Decision Making/ Wilson&P                                 Medical Decision Making Amount and/or Complexity of Data Reviewed Labs: ordered.  Risk Prescription drug management.   Braelon Sprung is here with sore throat.  Normal vitals.  No fever.  Overall appears to have Wilson pharyngitis on exam.  Exudates of both tonsils but I do not appreciate any abscess.  Uvula is midline.  There is erythema.  There is no trismus or drooling or submandibular swelling.  Normal range of motion of the neck.  Overall I think this is likely Wilson bad pharyngitis.  Her strep swab was negative.  She has been on Augmentin .  Will add clindamycin  and prednisone .  Patient given Decadron  shot in the ED.  I will swab for gonorrhea and chlamydia as well but at this time can continue antibiotics steroids and given strict return precautions.  I do not think there is any need for any imaging at this time but made aware of what to return for.  Discharged in good condition.  Understands return precautions.  Lab work was obtained in triage with Wilson CBC and CMP which are unremarkable except for mild leukocytosis which I suspect is from her throat infection.  Discharged in good condition.  This chart was dictated using voice recognition software.  Despite best efforts to proofread,  errors can occur which can change the documentation meaning.         Final Clinical Impression(s) / ED Diagnoses Final diagnoses:  Sore throat    Rx / DC Orders ED Discharge Orders          Ordered    clindamycin  (CLEOCIN ) 300 MG capsule  3 times daily        05/16/23 1253    predniSONE  (DELTASONE ) 10  MG tablet  Daily        05/16/23 1253              [place], DO 05/16/23 1256

## 2023-05-16 NOTE — ED Triage Notes (Signed)
 Patient presents with worsening sore throat. Dx with tonsillitis and prescribed augmentin . Reports subjective fever, nausea without vomiting, chills, increased difficulty swallowing. Tonsillar exudate and redness noted.

## 2023-05-16 NOTE — Discharge Instructions (Signed)
 Start clindamycin  today.  Take next dose of prednisone  tomorrow.  Return if symptoms worsen as we discussed.  Follow-up with your gonorrhea and Chlamydia testing your MyChart.  Will need to adjust antibiotics if these are positive.

## 2023-05-21 ENCOUNTER — Ambulatory Visit: Payer: Medicaid Other | Admitting: Family Medicine

## 2023-05-21 ENCOUNTER — Other Ambulatory Visit (HOSPITAL_COMMUNITY)
Admission: RE | Admit: 2023-05-21 | Discharge: 2023-05-21 | Disposition: A | Discharge status: Home / Self Care | Payer: Medicaid Other | Source: Ambulatory Visit | Attending: Family Medicine | Admitting: Family Medicine

## 2023-05-21 ENCOUNTER — Encounter: Payer: Self-pay | Admitting: Family Medicine

## 2023-05-21 VITALS — BP 111/77 | HR 99 | Ht 65.0 in | Wt 137.8 lb

## 2023-05-21 DIAGNOSIS — Z23 Encounter for immunization: Secondary | ICD-10-CM

## 2023-05-21 DIAGNOSIS — R070 Pain in throat: Secondary | ICD-10-CM

## 2023-05-21 NOTE — Patient Instructions (Signed)
It was great to see you today! Thank you for choosing [facility] for your primary care.  Today we addressed: Throat pain I am sorry this is still going on, but I am glad the pain is a bit better. I am not 100% sure what caused your symptoms, but it is very likely a virus or a bacteria, one of the ones we will test for. We are testing you again for mono, Strep throat, and gonorrhea/chlamydia.  I am also adding an HIV blood test. You will get a MyChart message or a letter if results are normal. Otherwise, you will get a call from [place]. Please continue taking your Augmentin and clindamycin until they run out.  If you gave gonorrhea, we will need to add another medicine.  If you cannot drink, cannot breathe, cannot talk, or your pain worsens, please call EMS or go to the ED right away.  Thank you for coming to see [place] at [facility] and for the opportunity to care for you! Xzavien Harada, MD 05/21/2023, 3:02 PM

## 2023-05-21 NOTE — Progress Notes (Signed)
   SUBJECTIVE:   CHIEF COMPLAINT / HPI:  Jesus Wilson is a 33 y.o. transgender male patient on estradiol and progesterone presenting to the clinic for protracted course of throat pain and tonsil exudate.  Throat pain Has been having throat pain since ~2/4. UC 2/6: Mono negative.  Started on Augmentin BID x10 days. ED 2/7: Left without being seen ED 2/7 repeat: Noted swelling of tonsils with exudate. Added lidocaine ED 2/8: WBC 15.3.  Strep swab negative. Given Decadron shot.  Clindamycin 300 mg TID and prednisone 10 mg 5 days added. EMS call 2/11: Passed out, called EMS, BP low, reportedly got IVF, declined hospital ride.  100.2 temp this AM, diaphoretic overnight. Throat has been hurting so much that has been difficult to even drink water, however has finally improved since yesterday patient has been able to hydrate better. GC/chlamydia throat swab still not resulted from 2/8. Has been drinking water, despite the pain.  PERTINENT PMH / PSH: No recent high risk sexual contacts, some oral sexual intercourse in recent months.   OBJECTIVE:   BP 111/77   Pulse 99   Ht 5\' 5"  (1.651 m)   Wt 137 lb 12.8 oz (62.5 kg)   SpO2 100%   BMI 22.93 kg/m   General: Uncomfortable appearing but NAD, alert and at neurological baseline. HEENT:  Head: Normocephalic, atraumatic. Eyes: PERRLA. No conjunctival erythema or scleral injections. Nose: Non-erythematous turbinates. No rhinorrhea. Mouth/Oral: Posterior pharynx erythema.  Significant tonsillar exudate covering entire tonsil surface area bilaterally.  MMM.  Does stutter intermittently, but no trismus or drooling.  No edema, fluctuance, or pain to palpation of exterior neck. Neck: Supple. Bilateral cervical LAD, no supraclavicular LAD. Cardiovascular: Regular rate and rhythm. Normal S1/S2. No murmurs, rubs, or gallops appreciated. 2+ radial pulses. Pulmonary: Clear bilaterally to ascultation. No increased WOB, no accessory muscle usage on  room air. No wheezes, crackles, or rhonchi. Skin: Warm and dry. Extremities: No peripheral edema bilaterally. Capillary refill <2 seconds.   ASSESSMENT/PLAN:   Assessment & Plan Throat pain Tonsillar exudate with pain differential includes viral pharyngitis, strep pharyngitis, gonococcal pharyngitis, mononucleosis, and diphtheria.  Concerning that patient has not fully improved despite protracted course of treatment and illness.  Reassuringly, pain is improving though physical exam still with significant tonsillar exudate.  Patient is able to tolerate fluid intake at this time and has nontoxic appearance with no indication for hospitalization. Viral pharyngitis unlikely given protracted course of illness.  Monospot previously negative, however will repeat PCR.  Strep point-of-care negative twice, however will repeat with culture.  Clindamycin and Augmentin would cover strep pharyngitis or other soft tissue infection well, however would not cover gonococcal pharyngitis.  Vaccinated for diphtheria. Will obtain HIV test given concern for immunocompromise in setting of protracted illness. -Continue Augmentin and clindamycin from ED/urgent care until course complete -Strep culture given possibility of false negative on point-of-care -Repeat GC/chlamydia throat swab -EBV PCR given possible false negative mono point of care -HIV given concern for immunocompromise status considering severity of illness -Strict ED precautions per AVS  Donte Lenzo Sharion Dove, MD [facility]

## 2023-05-22 LAB — GC/CHLAMYDIA PROBE AMP (~~LOC~~) NOT AT ARMC
Chlamydia: NEGATIVE
Comment: NEGATIVE
Comment: NORMAL
Neisseria Gonorrhea: NEGATIVE

## 2023-05-22 LAB — HIV ANTIBODY (ROUTINE TESTING W REFLEX): HIV Screen 4th Generation wRfx: NONREACTIVE

## 2023-05-22 LAB — EPSTEIN BARR VRS(EBV DNA BY PCR): EBV DNA, Quant PCR, Plasma: POSITIVE [IU]/mL

## 2023-05-23 ENCOUNTER — Other Ambulatory Visit: Payer: Self-pay | Admitting: Family Medicine

## 2023-05-23 ENCOUNTER — Encounter: Payer: Self-pay | Admitting: Family Medicine

## 2023-05-24 LAB — CULTURE, GROUP A STREP: Strep A Culture: NEGATIVE

## 2023-07-09 ENCOUNTER — Other Ambulatory Visit: Payer: Self-pay | Admitting: Family Medicine

## 2023-07-10 ENCOUNTER — Other Ambulatory Visit: Payer: Self-pay | Admitting: Family Medicine

## 2023-07-13 MED ORDER — PROGESTERONE 200 MG PO CAPS: 200.0000 mg | ORAL_CAPSULE | Freq: Every day | ORAL | 0 refills | Status: DC

## 2023-07-13 MED ORDER — SPIRONOLACTONE 100 MG PO TABS: 100.0000 mg | ORAL_TABLET | Freq: Every day | ORAL | 0 refills | Status: DC

## 2023-10-08 ENCOUNTER — Other Ambulatory Visit: Payer: Self-pay | Admitting: Family Medicine

## 2023-11-11 ENCOUNTER — Emergency Department (HOSPITAL_COMMUNITY)
Admission: EM | Admit: 2023-11-11 | Discharge: 2023-11-12 | Disposition: A | Discharge status: Other Acute Inpatient Hospital | Attending: Emergency Medicine | Admitting: Emergency Medicine

## 2023-11-11 ENCOUNTER — Other Ambulatory Visit: Payer: Self-pay

## 2023-11-11 DIAGNOSIS — F29 Unspecified psychosis not due to a substance or known physiological condition: Secondary | ICD-10-CM | POA: Diagnosis present

## 2023-11-11 DIAGNOSIS — R45851 Suicidal ideations: Secondary | ICD-10-CM | POA: Diagnosis not present

## 2023-11-11 DIAGNOSIS — F1010 Alcohol abuse, uncomplicated: Secondary | ICD-10-CM | POA: Diagnosis not present

## 2023-11-11 LAB — RAPID URINE DRUG SCREEN, HOSP PERFORMED
Amphetamines: NOT DETECTED
Barbiturates: NOT DETECTED
Benzodiazepines: NOT DETECTED
Cocaine: NOT DETECTED
Opiates: NOT DETECTED
Tetrahydrocannabinol: POSITIVE — AB

## 2023-11-11 LAB — ETHANOL: Alcohol, Ethyl (B): 276 mg/dL — ABNORMAL HIGH (ref <15)

## 2023-11-11 LAB — BASIC METABOLIC PANEL WITH GFR
Anion gap: 10 (ref 5–15)
BUN: 23 mg/dL — ABNORMAL HIGH (ref 6–20)
CO2: 25 mmol/L (ref 22–32)
Calcium: 8.4 mg/dL — ABNORMAL LOW (ref 8.9–10.3)
Chloride: 106 mmol/L (ref 98–111)
Creatinine, Ser: 0.88 mg/dL (ref 0.61–1.24)
GFR, Estimated: >60 mL/min (ref >60)
Glucose, Bld: 85 mg/dL (ref 70–99)
Potassium: 3.6 mmol/L (ref 3.5–5.1)
Sodium: 141 mmol/L (ref 135–145)

## 2023-11-11 LAB — CBC WITH DIFFERENTIAL/PLATELET
Abs Immature Granulocytes: 0.03 K/uL (ref 0.00–0.07)
Basophils Absolute: 0 K/uL (ref 0.0–0.1)
Basophils Relative: 1 %
Eosinophils Absolute: 0.1 K/uL (ref 0.0–0.5)
Eosinophils Relative: 1 %
HCT: 37.7 % — ABNORMAL LOW (ref 39.0–52.0)
Hemoglobin: 12.1 g/dL — ABNORMAL LOW (ref 13.0–17.0)
Immature Granulocytes: 0 %
Lymphocytes Relative: 32 %
Lymphs Abs: 2.6 K/uL (ref 0.7–4.0)
MCH: 30.9 pg (ref 26.0–34.0)
MCHC: 32.1 g/dL (ref 30.0–36.0)
MCV: 96.4 fL (ref 80.0–100.0)
Monocytes Absolute: 0.8 K/uL (ref 0.1–1.0)
Monocytes Relative: 9 %
Neutro Abs: 4.6 K/uL (ref 1.7–7.7)
Neutrophils Relative %: 57 %
Platelets: 226 K/uL (ref 150–400)
RBC: 3.91 MIL/uL — ABNORMAL LOW (ref 4.22–5.81)
RDW: 13.4 % (ref 11.5–15.5)
WBC: 8.1 K/uL (ref 4.0–10.5)
nRBC: 0 % (ref 0.0–0.2)

## 2023-11-11 MED ORDER — LORAZEPAM 1 MG PO TABS: 0.0000 mg | ORAL_TABLET | Freq: Two times a day (BID) | ORAL | Status: DC

## 2023-11-11 MED ORDER — THIAMINE MONONITRATE 100 MG PO TABS
100.0000 mg | ORAL_TABLET | Freq: Every day | ORAL | Status: DC
  Administered 2023-11-11 – 2023-11-12 (×2): 100 mg via ORAL
  Filled 2023-11-11: qty 1

## 2023-11-11 MED ORDER — LORAZEPAM 1 MG PO TABS
0.0000 mg | ORAL_TABLET | Freq: Four times a day (QID) | ORAL | Status: DC
  Administered 2023-11-11: 1 mg via ORAL
  Filled 2023-11-11: qty 2

## 2023-11-11 MED ORDER — THIAMINE HCL 100 MG/ML IJ SOLN: 100.0000 mg | Freq: Every day | INTRAMUSCULAR | Status: DC

## 2023-11-11 MED ORDER — ONDANSETRON HCL 4 MG PO TABS
4.0000 mg | ORAL_TABLET | Freq: Three times a day (TID) | ORAL | Status: DC | PRN
Start: 2023-11-11 — End: 2023-11-12
  Administered 2023-11-11: 4 mg via ORAL
  Filled 2023-11-11: qty 1

## 2023-11-11 NOTE — Consult Note (Signed)
 [facility] Psychiatric Consult Initial  Patient Name: .Jesus Wilson  MRN: [id number]  DOB: 01-16-1991  Consult Order details:  Orders (From admission, onward)     Start     Ordered   11/11/23 1527  CONSULT TO CALL ACT TEAM       Ordering Provider: Lenor Hollering, MD  Provider:  (Not yet assigned)  Question:  Reason for Consult?  Answer:  Psych consult   11/11/23 1527             Mode of Visit: In person    Psychiatry Consult Evaluation  Service Date: November 11, 2023 LOS:  LOS: 0 days  Chief Complaint My mom called the police and they brought me here  Primary Psychiatric Diagnoses  Suicidal Ideation 2.  Psychosis 3.  Alcohol Abuse  Assessment  Jesus Wilson is a 33 y.o. adult admitted: Presented to the EDfor 11/11/2023  1:56 PM for brought in by police due to suicidal ideation. She carries the psychiatric diagnoses of ADHD and bulimia and has a past medical history of stuttering and male to male transgender.   Her current presentation of expressing self harm is most consistent with suicidal ideation. She meets criteria for inpatient psychiatric hospitalization based on being a danger to herself and acute psychosis.  Current outpatient psychotropic medications include none and historically she has had a N/A response to these medications. Compliance with medications prior to admission is not applicable due to none being prescribed. On initial examination, patient is tearful and cooperative. Please see plan below for detailed recommendations.   Diagnoses:  Active Hospital problems: Principal Problem:   Suicidal ideation Active Problems:   Psychosis (HCC)   Alcohol abuse    Plan   ## Psychiatric Medication Recommendations:  Start  --CIWA   ## Medical Decision Making Capacity: Not specifically addressed in this encounter  ## Further Work-up:   -- most recent EKG on 11/11/2023 had QtC of 422 -- Pertinent labwork reviewed earlier this admission includes: CBC, CMP and  alcohol; UDS pending   ## Disposition:-- We recommend inpatient psychiatric hospitalization after medical hospitalization. Patient has been involuntarily committed on 11/11/2023.   ## Behavioral / Environmental: -Utilize compassion and acknowledge the patient's experiences while setting clear and realistic expectations for care.    ## Safety and Observation Level:  - Based on my clinical evaluation, I estimate the patient to be at high risk of self harm in the current setting. - At this time, we recommend  1:1 Observation. This decision is based on my review of the chart including patient's history and current presentation, interview of the patient, mental status examination, and consideration of suicide risk including evaluating suicidal ideation, plan, intent, suicidal or self-harm behaviors, risk factors, and protective factors. This judgment is based on our ability to directly address suicide risk, implement suicide prevention strategies, and develop a safety plan while the patient is in the clinical setting. Please contact our team if there is a concern that risk level has changed.  CSSR Risk Category:C-SSRS RISK CATEGORY: High Risk  Suicide Risk Assessment: Patient has following modifiable risk factors for suicide: active suicidal ideation, untreated depression, active mental illness (to encompass adhd, tbi, mania, psychosis, trauma reaction), and current symptoms: anxiety/panic, insomnia, impulsivity, anhedonia, hopelessness, which we are addressing by recommending inpatient psychiatric hospitalization. Patient has following non-modifiable or demographic risk factors for suicide: male gender and history of self harm behavior Patient has the following protective factors against suicide: Access to outpatient mental health care and  Supportive family  Thank you for this consult request. Recommendations have been communicated to the primary team.  We will continue to follow at this time.   Jesus Wilson Patient, NP       History of Present Illness  Relevant Aspects of Hospital ED Course:  Admitted on 11/11/2023 for  brought in by police due to suicidal ideation. She carries the psychiatric diagnoses of ADHD and bulimia and has a past medical history of stuttering and male to male transgender.   Patient Report:  Jesus Wilson, is seen face to face by this provider, consulted with Dr. Larina; and chart reviewed on 11/11/23.  On evaluation Jesus Wilson reports she is suicidal and has been suicidal since she was 33 years old.  In addition, patient endorses visual and auditory hallucinations which she has experienced since she was 33 years old.  The hallucinations are negative; she says they talk about me all the time; they hate me.  She says she has difficulty determining if what she sees and hears is real during the day, but she knows that what she experiences at night is not reality.  Patient reports waking up at night and finding 3 or 4 people in her room saying mean things to her.    Patient also reports heavy daily drinking of 3/4 of a fifth of liquor each day.  She reports starting her day with 3 shots and carrying a flask that she drinks from all day long.  Patient endorses regularly experiencing blackouts. Patient is self harming; she has fresh cuts on her right thigh and scars on her left forearm.  She says these are the two places she cuts.   Patient says she has significant difficulty sleeping usually getting no more than 3 to 4 hours per night.  She reports several different times when she has not slept at all for days at a time.  She also says she has a bad issue with having sex with my bosses and managers.  During evaluation Jesus Wilson is laying in bed in mild distress.  She is alert & oriented x 4, calm, cooperative and attentive for this assessment.  Her mood is depressed with congruent affect.  She has normal speech, and behavior.  Objectively there is evidence of psychosis/mania or  delusional thinking. Pt does not appear to be responding to internal or external stimuli.  Patient is able to converse coherently, goal directed thoughts, no distractibility, or pre-occupation.  She endorses suicidal/self-harm ideation, psychosis, and paranoia; patient denies homicidal ideation.  Patient answered question appropriately.    I personally spent a total of 65 minutes in the care of the patient today including preparing to see the patient, getting/reviewing separately obtained history, performing a medically appropriate exam/evaluation, counseling and educating, placing orders, referring and communicating with other health care professionals, documenting clinical information in the EHR, independently interpreting results, communicating results, and coordinating care.  Psych ROS:  Depression: endorses Anxiety:  endorses Mania (lifetime and current): endorses Psychosis: (lifetime and current): endorses  Collateral information:  Contacted Adar Rase at [phone] on 11/11/2023 See note by Chesley Holt [facility]  Review of Systems  Neurological:        Stutter  Psychiatric/Behavioral:  Positive for depression, hallucinations, substance abuse and suicidal ideas. The patient has insomnia.   All other systems reviewed and are negative.    Psychiatric and Social History  Psychiatric History:  Information collected from patient, mother and chart review  Prev Dx/Sx: ADHD and bulimia  Current Psych Provider: none Home Meds (current): none Previous Med Trials: unknown Therapy: none  Prior Psych Hospitalization: none  Prior Self Harm: yes, cutting since 33 years old Prior Violence: denies  Family Psych History: Father - bipolar and schizophrenia Family Hx suicide: denies  Social History:  Developmental Hx: WNL Educational Hx: high school graduate Occupational Hx: works at [employer] Hx: none Living Situation: Lives with Mom Spiritual Hx: denies Access to  weapons/lethal means: denies   Substance History Alcohol: endorses  Type of alcohol liquor Last Drink 11/11/23 Number of drinks per day 3/4 of a 5th History of alcohol withdrawal seizures unknown History of DT's unknown Tobacco: denies Illicit drugs: denies Prescription drug abuse: denies Rehab hx: denies  Exam Findings  Physical Exam:  Vital Signs:  Temp:  [98.6 F (37 C)] 98.6 F (37 C) (08/06 1504) Pulse Rate:  [72] 72 (08/06 1504) Resp:  [16] 16 (08/06 1504) BP: (111)/(79) 111/79 (08/06 1504) SpO2:  [99 %] 99 % (08/06 1504) Weight:  [65.8 kg] 65.8 kg (08/06 1428) Blood pressure 111/79, pulse 72, temperature 98.6 F (37 C), temperature source Oral, resp. rate 16, height 5' 5 (1.651 m), weight 65.8 kg, SpO2 99%. Body mass index is 24.13 kg/m.  Physical Exam Vitals and nursing note reviewed.  Eyes:     Pupils: Pupils are equal, round, and reactive to light.  Pulmonary:     Effort: Pulmonary effort is normal.  Skin:    General: Skin is dry.     Comments: Red cuts on right thigh  Neurological:     Mental Status: She is alert and oriented to person, place, and time.  Psychiatric:        Attention and Perception: Attention normal. She perceives auditory and visual hallucinations.        Mood and Affect: Mood is depressed. Affect is tearful.        Behavior: Behavior is actively hallucinating. Behavior is cooperative.        Thought Content: Thought content is delusional. Thought content includes suicidal ideation.        Cognition and Memory: Cognition and memory normal.        Judgment: Judgment is impulsive.     Mental Status Exam: General Appearance: Disheveled  Orientation:  Full (Time, Place, and Person)  Memory:  Immediate;   Fair Recent;   Fair Remote;   Fair  Concentration:  Concentration: Fair  Recall:  Fair  Attention  Fair  Eye Contact:  Good  Speech:  Stutters  Language:  Fair  Volume:  Normal  Mood: depressed  Affect:  Tearful  Thought  Process:  Linear  Thought Content:  Hallucinations: Auditory Visual  Suicidal Thoughts:  Yes.  with intent/plan  Homicidal Thoughts:  No  Judgement:  Impaired  Insight:  Lacking  Psychomotor Activity:  Restlessness  Akathisia:  No  Fund of Knowledge:  Fair      Assets:  Leisure Time Physical Health Social Support  Cognition:  WNL  ADL's:  Intact  AIMS (if indicated):        Other History   These have been pulled in through the EMR, reviewed, and updated if appropriate.  Family History:  The patient's family history is not on file.  Medical History: Past Medical History:  Diagnosis Date   Seizures (HCC)    Stutter     Surgical History: No past surgical history on file.   Medications:  No current facility-administered medications for this encounter.  Current Outpatient  Medications:    amoxicillin -clavulanate (AUGMENTIN ) 875-125 MG tablet, Take 1 tablet by mouth every 12 (twelve) hours., Disp: 20 tablet, Rfl: 0   estradiol  valerate (DELESTROGEN ) 20 MG/ML injection, INJECT 0.3 MILLILITERS INTO THIGH EVERY WEEK AS DIRECTED BY DOCTOR, Disp: 10 mL, Rfl: 2   lidocaine  (XYLOCAINE ) 2 % solution, Use as directed 15 mLs in the mouth or throat as needed for mouth pain., Disp: 100 mL, Rfl: 0   progesterone  (PROMETRIUM ) 200 MG capsule, Take 1 capsule (200 mg total) by mouth daily., Disp: 90 capsule, Rfl: 0   spironolactone  (ALDACTONE ) 100 MG tablet, TAKE 1 TABLET BY MOUTH DAILY, Disp: 90 tablet, Rfl: 0  Allergies: No Known Allergies  Nevaeh Korte A Amillya Chavira, NP

## 2023-11-11 NOTE — ED Notes (Signed)
 Mother came to visit     patient has been well behaved since meds were given

## 2023-11-11 NOTE — ED Triage Notes (Signed)
 Patient states she feels stupid and want to harm herself. Patients plan is to cut her throat.
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
## 2023-11-11 NOTE — ED Notes (Signed)
 Patient wanded by security.

## 2023-11-11 NOTE — Progress Notes (Signed)
 Pt was accepted to [facility]U on 11/11/2023 . Bed assignment:315   Pt meets inpatient criteria per Efrain Patient, NP  Attending Physician will be: Dr.Jadepalle    Report can be called to: [phone]  Care Team Notified: [facility] Cherylynn Ernst, RN, Barnie Bud, Paramedic, Efrain Patient, NP, Beryl Sprang, RN

## 2023-11-11 NOTE — ED Notes (Signed)
 Patient's clothing: top, pants, flip flops placed in patient belongings bag and placed in locker #30 in TCU

## 2023-11-11 NOTE — ED Notes (Signed)
 Patient tried to leave the area    she was looking for an exit sign    PD was unable to help without IVC papers    we ran into Dr Lenor and she said the IVC papers have been done   it was explained to the patient that with the IVC papers that if she tries to leave that PD and security would make her return to her room   she is currently in her room   there are techs staged on both sides of her door

## 2023-11-11 NOTE — ED Notes (Addendum)
 [facility] called pts mother, Antino Mayabb for collateral information. Per pts mother, pt texted her mother while she was at work to say I'm in the dryer. Pts mother asked pt to explain but she was not able to. Pts mother left work and came home to find pt sitting on the bed crying saying I don't feel comfortable in my skin. Pts mother asked pt if she wanted to go to the hospital. Pt then said I need to talk to some one and then collapsed on the floor crying. Pt mother called 911 and pt was brought to the [facility] ED.  Per pts mother pt has a history of ADHD as an 9 year old child, was taking medication for a few years and then was taken off of the medication. Per pts mother, pt has a history of self harm (cutting), bulimia, and seizures. Per pts mother pt will pass out sometimes and regurgitate a black substance. Pt has been evaluated and had brain scans though nothing has been found. Pt has a history of vaping tobacco and smoking crack cocaine, and currently smokes marijuana occasionally and drinks alcohol. Pts mother said she was unaware of much alcohol pt consumes because pt minimizes her use and they work opposite shifts so they do not see each other that much. Per pts mother pt was dating a man for a while and they moved in together which is when pt first smoked crack.   Per pts mother pt has a stutter. Pts mother reports a family mental health history with mother's sister having depression and taking medication. Pts mother reports no other mental illness on her side and is unaware any mental illness on pts father's side of the family. Pt was in counseling when first beginning her gender transition but did not like her therapist at the time and did not continue with counseling. Per pts mother pt receives her hormone prescriptions from a PCP. Previously pt obtained her hormone injections from the internet. Pts mother is in possession of her hormone injection and will be bringing it to the ED this evening. Pts  is scheduled to inject herself on Thursdays. Per pts mother, pt has no past suicide attempts that she is aware of.   Chesley Holt, [facility]  11/11/23

## 2023-11-12 ENCOUNTER — Encounter: Payer: Self-pay | Admitting: Psychiatry

## 2023-11-12 ENCOUNTER — Inpatient Hospital Stay
Admission: AD | Admit: 2023-11-12 | Discharge: 2023-11-20 | DRG: 885 | Disposition: A | Discharge status: Home / Self Care | Payer: Self-pay | Source: Intra-hospital | Attending: Psychiatry | Admitting: Psychiatry

## 2023-11-12 DIAGNOSIS — Y903 Blood alcohol level of 60-79 mg/100 ml: Secondary | ICD-10-CM | POA: Diagnosis present

## 2023-11-12 DIAGNOSIS — Z5982 Transportation insecurity: Secondary | ICD-10-CM

## 2023-11-12 DIAGNOSIS — F419 Anxiety disorder, unspecified: Secondary | ICD-10-CM | POA: Diagnosis present

## 2023-11-12 DIAGNOSIS — Z79899 Other long term (current) drug therapy: Secondary | ICD-10-CM

## 2023-11-12 DIAGNOSIS — F332 Major depressive disorder, recurrent severe without psychotic features: Principal | ICD-10-CM | POA: Diagnosis present

## 2023-11-12 DIAGNOSIS — F1020 Alcohol dependence, uncomplicated: Secondary | ICD-10-CM | POA: Diagnosis present

## 2023-11-12 DIAGNOSIS — F1411 Cocaine abuse, in remission: Secondary | ICD-10-CM | POA: Diagnosis present

## 2023-11-12 DIAGNOSIS — F640 Transsexualism: Secondary | ICD-10-CM | POA: Diagnosis present

## 2023-11-12 DIAGNOSIS — Z5986 Financial insecurity: Secondary | ICD-10-CM | POA: Diagnosis not present

## 2023-11-12 DIAGNOSIS — F1290 Cannabis use, unspecified, uncomplicated: Secondary | ICD-10-CM | POA: Diagnosis present

## 2023-11-12 DIAGNOSIS — Z818 Family history of other mental and behavioral disorders: Secondary | ICD-10-CM

## 2023-11-12 DIAGNOSIS — Z604 Social exclusion and rejection: Secondary | ICD-10-CM | POA: Diagnosis present

## 2023-11-12 DIAGNOSIS — F17290 Nicotine dependence, other tobacco product, uncomplicated: Secondary | ICD-10-CM | POA: Diagnosis present

## 2023-11-12 DIAGNOSIS — R45851 Suicidal ideations: Principal | ICD-10-CM | POA: Diagnosis present

## 2023-11-12 DIAGNOSIS — Z9152 Personal history of nonsuicidal self-harm: Secondary | ICD-10-CM

## 2023-11-12 DIAGNOSIS — F29 Unspecified psychosis not due to a substance or known physiological condition: Secondary | ICD-10-CM | POA: Diagnosis not present

## 2023-11-12 MED ORDER — LORAZEPAM 2 MG/ML IJ SOLN: 2.0000 mg | Freq: Three times a day (TID) | INTRAMUSCULAR | Status: DC | PRN

## 2023-11-12 MED ORDER — DIPHENHYDRAMINE HCL 25 MG PO CAPS: 50.0000 mg | ORAL_CAPSULE | Freq: Three times a day (TID) | ORAL | Status: DC | PRN

## 2023-11-12 MED ORDER — HALOPERIDOL LACTATE 5 MG/ML IJ SOLN: 5.0000 mg | Freq: Three times a day (TID) | INTRAMUSCULAR | Status: DC | PRN

## 2023-11-12 MED ORDER — SPIRONOLACTONE 25 MG PO TABS
100.0000 mg | ORAL_TABLET | Freq: Every day | ORAL | Status: DC
  Administered 2023-11-13 – 2023-11-20 (×11): 100 mg via ORAL
  Filled 2023-11-12 (×5): qty 4
  Filled 2023-11-12: qty 1
  Filled 2023-11-12 (×4): qty 4

## 2023-11-12 MED ORDER — DIPHENHYDRAMINE HCL 50 MG/ML IJ SOLN: 50.0000 mg | Freq: Three times a day (TID) | INTRAMUSCULAR | Status: DC | PRN

## 2023-11-12 MED ORDER — LORAZEPAM 2 MG PO TABS
0.0000 mg | ORAL_TABLET | Freq: Two times a day (BID) | ORAL | Status: AC
  Administered 2023-11-14: 1 mg via ORAL
  Filled 2023-11-12: qty 1

## 2023-11-12 MED ORDER — ONDANSETRON HCL 4 MG PO TABS: 4.0000 mg | ORAL_TABLET | Freq: Three times a day (TID) | ORAL | Status: DC | PRN

## 2023-11-12 MED ORDER — THIAMINE MONONITRATE 100 MG PO TABS
100.0000 mg | ORAL_TABLET | Freq: Every day | ORAL | Status: DC
  Administered 2023-11-13 – 2023-11-20 (×11): 100 mg via ORAL
  Filled 2023-11-12 (×8): qty 1

## 2023-11-12 MED ORDER — ACETAMINOPHEN 325 MG PO TABS: 650.0000 mg | ORAL_TABLET | Freq: Four times a day (QID) | ORAL | Status: DC | PRN

## 2023-11-12 MED ORDER — ALUM & MAG HYDROXIDE-SIMETH 200-200-20 MG/5ML PO SUSP
30.0000 mL | ORAL | Status: DC | PRN
  Administered 2023-11-13: 30 mL via ORAL
  Filled 2023-11-12: qty 30

## 2023-11-12 MED ORDER — MAGNESIUM HYDROXIDE 400 MG/5ML PO SUSP: 30.0000 mL | Freq: Every day | ORAL | Status: DC | PRN

## 2023-11-12 MED ORDER — HALOPERIDOL 5 MG PO TABS: 5.0000 mg | ORAL_TABLET | Freq: Three times a day (TID) | ORAL | Status: DC | PRN

## 2023-11-12 MED ORDER — PROGESTERONE MICRONIZED 100 MG PO CAPS
200.0000 mg | ORAL_CAPSULE | Freq: Every day | ORAL | Status: DC
  Administered 2023-11-13 – 2023-11-20 (×11): 200 mg via ORAL
  Filled 2023-11-12 (×9): qty 2

## 2023-11-12 MED ORDER — THIAMINE HCL 100 MG/ML IJ SOLN: 100.0000 mg | Freq: Every day | INTRAMUSCULAR | Status: DC

## 2023-11-12 MED ORDER — HALOPERIDOL LACTATE 5 MG/ML IJ SOLN: 10.0000 mg | Freq: Three times a day (TID) | INTRAMUSCULAR | Status: DC | PRN

## 2023-11-12 MED ORDER — LORAZEPAM 2 MG PO TABS: 0.0000 mg | ORAL_TABLET | Freq: Four times a day (QID) | ORAL | Status: AC

## 2023-11-12 NOTE — ED Notes (Signed)
 Transport called.

## 2023-11-12 NOTE — ED Provider Notes (Addendum)
 Emergency Medicine Observation Re-evaluation Note  Jesus Wilson is a 33 y.o. adult, seen on rounds today.  Pt initially presented to the ED for complaints of Suicidal Currently, the patient is resting quietly in room without distress.  Physical Exam  BP 111/65   Pulse 64   Temp 98.4 F (36.9 C) (Oral)   Resp 20   Ht 5' 5 (1.651 m)   Wt 65.8 kg   SpO2 100%   BMI 24.13 kg/m  Physical Exam General: Resting without agitation Cardiac: No murmur on exam Lungs: Clear breath sounds bilaterally Psych: No agitation at this time  ED Course / MDM  EKG:EKG Interpretation Date/Time:  Wednesday November 11 2023 16:06:50 EDT Ventricular Rate:  75 PR Interval:  144 QRS Duration:  90 QT Interval:  378 QTC Calculation: 422 R Axis:   74  Text Interpretation: Normal sinus rhythm Normal ECG No previous ECGs available Confirmed by Raford Lenis ([id number]) on 11/11/2023 11:20:00 PM  I have reviewed the labs performed to date as well as medications administered while in observation.  Recent changes in the last 24 hours include none reported by overnight nursing.  Plan  Current plan is for awaiting psychiatric recommendations.    Joleena Weisenburger, Lonni PARAS, MD 11/12/23 0734  11:40 AM Was informed by nursing that patient is excepted to [facility] under care of Dr.Jadepalle .   Will do EMTALA and patient will be transferred.   Benigna Delisi, Lonni PARAS, MD 11/12/23 1140

## 2023-11-12 NOTE — ED Notes (Signed)
 [facility] provided pt and her mother counseling referrals for follow up after pts is discharged. [facility] included [facility] and several LGBT affirming therapists.   Chesley Holt, [facility]  11/12/23

## 2023-11-12 NOTE — Group Note (Signed)
 Date:  11/12/2023 Time:  8:48 PM  Group Topic/Focus:  Wrap-Up Group:   The focus of this group is to help patients review their daily goal of treatment and discuss progress on daily workbooks.    Participation Level:  Minimal  Participation Quality:  Appropriate  Affect:  Appropriate  Cognitive:  Appropriate  Insight: Appropriate  Engagement in Group:  Limited  Modes of Intervention:  Discussion  Additional Comments:     Kerri Katz 11/12/2023, 8:48 PM

## 2023-11-12 NOTE — Plan of Care (Signed)
  Problem: Education: Goal: Emotional status will improve Outcome: Progressing Goal: Mental status will improve Outcome: Progressing Goal: Verbalization of understanding the information provided will improve Outcome: Progressing   Problem: Health Behavior/Discharge Planning: Goal: Compliance with treatment plan for underlying cause of condition will improve Outcome: Progressing   Problem: Physical Regulation: Goal: Ability to maintain clinical measurements within normal limits will improve Outcome: Progressing   Problem: Safety: Goal: Periods of time without injury will increase Outcome: Progressing

## 2023-11-12 NOTE — Group Note (Signed)
 [facility] LCSW Group Therapy Note   Group Date: 11/12/2023 Start Time: 1300 End Time: 1450   Type of Therapy and Topic: Group Therapy: Avoiding Self-Sabotaging and Enabling Behaviors  Participation Level: Did Not Attend  Mood:  Description of Group:  In this group, patients will learn how to identify obstacles, self-sabotaging and enabling behaviors, as well as: what are they, why do we do them and what needs these behaviors meet. Discuss unhealthy relationships and how to have positive healthy boundaries with those that sabotage and enable. Explore aspects of self-sabotage and enabling in yourself and how to limit these self-destructive behaviors in everyday life.   Therapeutic Goals: 1. Patient will identify one obstacle that relates to self-sabotage and enabling behaviors 2. Patient will identify one personal self-sabotaging or enabling behavior they did prior to admission 3. Patient will state a plan to change the above identified behavior 4. Patient will demonstrate ability to communicate their needs through discussion and/or role play.    Summary of Patient Progress:   Patient did not attend.    Therapeutic Modalities:  Cognitive Behavioral Therapy Person-Centered Therapy Motivational Interviewing    Alveta CHRISTELLA Kerns, LCSW

## 2023-11-12 NOTE — ED Notes (Signed)
 [facility] called pts mother to inform her that pt has been accepted to [facility] and will transported sometime today. Pts mother will be coming this morning around 10am to visit again before pt is transported to BMU. Pts mother will be bringing clothing for pts discharge and he medication to be documented in pts chart. Pt takes her self administered hormone injections every Thursday.  Chesley Holt, [facility]  11/12/23

## 2023-11-12 NOTE — Progress Notes (Addendum)
 Patient arrived IVC from [facility] ED. Patient is alert and guarded but cooperative. Patient states that I just want to be happy. Patient does have a stutter and reports feeling rushed makes the stutter worse. Patient was searched and no contraband found. All forms signed. Patient oriented to the unit and all questions answered. Will continue to monitor.    11/12/23 1330  Psych Admission Type (Psych Patients Only)  Admission Status Involuntary  Psychosocial Assessment  Patient Complaints Depression;Hopelessness;Self-harm thoughts  Eye Contact Brief  Facial Expression Sad  Affect Depressed;Sad  Speech Logical/coherent;Other (Comment) (Patient has a stutter at baseline)  Interaction Guarded;Minimal  Motor Activity Slow  Appearance/Hygiene Unremarkable;In scrubs  Behavior Characteristics Cooperative;Guarded  Mood Depressed;Sad;Pleasant  Thought Process  Coherency WDL  Content Paranoia;Blaming others  Delusions Paranoid (Patient reports feeling like people are talking about her, especially when she is at work.)  Perception Hallucinations  Hallucination Visual (Patient reports occasionally seeing people out of the corner of my eye.)  Judgment Poor (Improving. Patient reports asking for help for years and has not received it.)  Confusion None  Danger to Self  Current suicidal ideation? Denies  Self-Injurious Behavior Self-injurious ideation verbalized  Agreement Not to Harm Self Yes  Description of Agreement Verbal  Danger to Others  Danger to Others None reported or observed

## 2023-11-12 NOTE — Tx Team (Signed)
 Initial Treatment Plan 11/12/2023 4:38 PM Jesus Wilson [id number]    PATIENT STRESSORS: Occupational concerns   Substance abuse     PATIENT STRENGTHS: Ability for insight  Capable of independent living  Communication skills  Motivation for treatment/growth  Work skills    PATIENT IDENTIFIED PROBLEMS: I just want to be happy.                     DISCHARGE CRITERIA:  Ability to meet basic life and health needs Improved stabilization in mood, thinking, and/or behavior Verbal commitment to aftercare and medication compliance Withdrawal symptoms are absent or subacute and managed without 24-hour nursing intervention  PRELIMINARY DISCHARGE PLAN: Attend PHP/IOP Attend 12-step recovery group Return to previous living arrangement Return to previous work or school arrangements  PATIENT/FAMILY INVOLVEMENT: This treatment plan has been presented to and reviewed with the patient, Jesus Wilson.  The patient has been given the opportunity to ask questions and make suggestions.  Leita MARLA Rosella, RN 11/12/2023, 4:38 PM

## 2023-11-12 NOTE — Group Note (Deleted)
 LCSW Group Therapy Note  Group Date: 11/12/2023 Start Time: 1315 End Time: 1400   Type of Therapy and Topic:  Group Therapy: Positive Affirmations  Participation Level:  {[facility] PARTICIPATION [id number]}   Description of Group:   This group addressed positive affirmation towards self and others.  Patients went around the room and identified two positive things about themselves and two positive things about a peer in the room.  Patients reflected on how it felt to share something positive with others, to identify positive things about themselves, and to hear positive things from others/ Patients were encouraged to have a daily reflection of positive characteristics or circumstances.   Therapeutic Goals: 1. Patients will verbalize two of their positive qualities 2. Patients will demonstrate empathy for others by stating two positive qualities about a peer in the group 3. Patients will verbalize their feelings when voicing positive self affirmations and when voicing positive affirmations of others 4. Patients will discuss the potential positive impact on their wellness/recovery of focusing on positive traits of self and others.  Summary of Patient Progress:  *** actively engaged in the discussion and . S*** was able***or not able to identify positive affirmations about ***self as well as other group members. Patient demonstrated *** insight into the subject matter, was respectful of peers, participated throughout the entire session.  Therapeutic Modalities:   Cognitive Behavioral Therapy Motivational Interviewing    Lum JONETTA Croft, [place] 11/12/2023  2:09 PM

## 2023-11-12 NOTE — ED Notes (Signed)
Report called to [facility]

## 2023-11-13 MED ORDER — ONDANSETRON 4 MG PO TBDP
4.0000 mg | ORAL_TABLET | Freq: Three times a day (TID) | ORAL | Status: DC | PRN
  Administered 2023-11-13: 4 mg via ORAL
  Filled 2023-11-13: qty 1

## 2023-11-13 MED ORDER — ESCITALOPRAM OXALATE 10 MG PO TABS
5.0000 mg | ORAL_TABLET | Freq: Every day | ORAL | Status: DC
  Administered 2023-11-13 – 2023-11-16 (×5): 5 mg via ORAL
  Filled 2023-11-13 (×4): qty 1
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
## 2023-11-13 NOTE — [facility] Suicide Risk Assessment (Signed)
 [facility] INPATIENT:  Family/Significant Other Suicide Prevention Education  Suicide Prevention Education:  Education Completed; Rosaline Mustard/mother [phone]), has been identified by the patient as the family member/significant other with whom the patient will be residing, and identified as the person(s) who will aid the patient in the event of a mental health crisis (suicidal ideations/suicide attempt).  With written consent from the patient, the family member/significant other has been provided the following suicide prevention education, prior to the and/or following the discharge of the patient.  The suicide prevention education provided includes the following: Suicide risk factors Suicide prevention and interventions National Suicide Hotline telephone number [facility] assessment telephone number [facility] Emergency Assistance 911 [facility] and/or Residential Mobile Crisis Unit telephone number  Request made of family/significant other to: Remove weapons (e.g., guns, rifles, knives), all items previously/currently identified as safety concern.   Remove drugs/medications (over-the-counter, prescriptions, illicit drugs), all items previously/currently identified as a safety concern.  The family member/significant other verbalizes understanding of the suicide prevention education information provided.  The family member/significant other agrees to remove the items of safety concern listed above.  Tarte stated that pt has been struggling with mental health issues since she started transitioning. Mother shared that she has done some therapy in the past but discontinued this when they figured out the provider could not prescribe medication. Schopf stated that prior to admission pt shared that she felt uncomfortable in her own skin. She shared that pt has a history of cutting and purging. She denied pt having access to any guns but does share that pt collects knives, throwing  stars, swords and has done this since before her transition.   Nadara JONELLE Fam 11/13/2023, 3:46 PM

## 2023-11-13 NOTE — Group Note (Signed)
 Date:  11/13/2023 Time:  8:49 PM  Group Topic/Focus:  Wrap-Up Group:   The focus of this group is to help patients review their daily goal of treatment and discuss progress on daily workbooks.    Participation Level:  Minimal  Participation Quality:  Appropriate, Attentive, and Resistant  Affect:  Appropriate and Flat  Cognitive:  Alert and Appropriate  Insight: Appropriate  Engagement in Group:  Lacking, Limited, and Resistant  Modes of Intervention:  Discussion  Additional Comments:     Maglione,Mikisha Roseland E 11/13/2023, 8:49 PM

## 2023-11-13 NOTE — H&P (Signed)
 Psychiatric Admission Assessment Adult  Patient Identification: Jesus Wilson MRN:  [id number] Date of Evaluation:  11/13/2023 Chief Complaint:  Suicidal ideations [R45.851]   History of Present Illness: Jesus Wilson is a 33 yo transgender male to male who presents to this setting following ED appearance being brought in by law enforcement related to concern for SI. Patient has past psychiatric history of ADHD and bulimia and past medical history of stuttering.   Per Chart Review Urine drug screen was positive for tetrahydrocannabinol. Blood alcohol level was elevated (>76) at time of admission. CMP was unremarkable except for BUN 23 and calcium 8.4. EKG performed on 11/11/23 noted QTc 422.  Patient is seen today for initial psychiatric evaluation. Upon approach, she is noted to be dressed appropriately for the day with good grooming and hygiene. She appears withdrawn, with flat affect. She states, "I just want to feel happy." She was brought in by police due to suicidal ideation reported by her mother. She reports struggling with work and home stressors however provides has good relationship with her mother.   She reports longstanding symptoms of depression, including sadness, anhedonia, isolation, chronic worry, irritability, hopelessness, low motivation, and poor sleep. She denies any prior psychiatric inpatient or outpatient treatment. She denies prior psychiatric medication trials. She denies hallucinations, paranoia, delusions, or trauma history.  She reports a history of cutting since age 32, stating this was to relieve stress, not as a suicide attempt. She denies past suicide attempts, stating, "I'm too scared to do that." She currently denies suicidal ideation but reports chronic feelings of sadness. Denies HI.   She denies current symptoms of mania or PTSD. She reports some anxiety and restlessness but no panic attacks or flashbacks.  Psychiatric History Previous Diagnoses: ADHD,  Bulimia Current Psychiatric Provider: None Psychotropic Medications: None currently Previous Med Trials: Unknown Therapy History: None Prior Hospitalizations: None Self-harm History: Cutting behavior since age 25 History of Violence: Denied  Family Psychiatric History Father: Bipolar disorder, Schizophrenia Suicide in family: Denied  Social History Origin: Originally from [place] ; moved to [place]  at age 74 Living Situation: Lives with mother Education: High school graduate Occupation: Full-time at [employer] History: None reported Spirituality: Denied Access to lethal means: Denied  Substance Use History Alcohol: Began drinking at age 4. Currently reports drinking >1 bottle of whiskey per day. Last drink 11/11/23. Marijuana: Daily use; admits to current use Past Drug Use: Reports past significant crack cocaine use; sober for 2 years Tobacco: Denied Other substances / Prescription abuse: Denied Withdrawal History: No reported seizures or DTs Rehab History: Denied   Total Time spent with patient: 1 hour Sleep  Sleep:No data recorded Is the patient at risk to self? Yes.    Has the patient been a risk to self in the past 6 months? Yes.    Has the patient been a risk to self within the distant past? Yes.    Is the patient a risk to others? No.  Has the patient been a risk to others in the past 6 months? No.  Has the patient been a risk to others within the distant past? No.   [place] Scale:  Flowsheet Row Admission (Current) from 11/12/2023 in [facility] INPATIENT BEH[facility] MEDICINE ED from 11/11/2023 in [facility] Emergency Department at [facility] ED from 05/16/2023 in [facility] Emergency Department at [facility]  C-SSRS RISK CATEGORY High Risk High Risk No Risk     Past Medical History:  Past Medical History:  Diagnosis Date  Seizures (HCC)    Stutter     Past Surgical History:  Procedure Laterality Date   NO PAST SURGERIES     Family  History: History reviewed. No pertinent family history.  Social History:  Social History   Substance and Sexual Activity  Alcohol Use Yes   Comment: Patient reports 3/4ths of a 5th of liquor a day     Social History   Substance and Sexual Activity  Drug Use Yes   Types: Marijuana      Allergies:  No Known Allergies Lab Results:  Results for orders placed or performed during the hospital encounter of 11/11/23 (from the past 48 hours)  Basic metabolic panel     Status: Abnormal   Collection Time: 11/11/23  2:20 PM  Result Value Ref Range   Sodium 141 135 - 145 mmol/L   Potassium 3.6 3.5 - 5.1 mmol/L   Chloride 106 98 - 111 mmol/L   CO2 25 22 - 32 mmol/L   Glucose, Bld 85 70 - 99 mg/dL    Comment: Glucose reference range applies only to samples taken after fasting for at least 8 hours.   BUN 23 (H) 6 - 20 mg/dL   Creatinine, Ser 9.11 0.61 - 1.24 mg/dL   Calcium 8.4 (L) 8.9 - 10.3 mg/dL   GFR, Estimated >39 >39 mL/min    Comment: (NOTE) Calculated using the CKD-EPI Creatinine Equation (2021)    Anion gap 10 5 - 15    Comment: Performed at [facility], [address], [place], [place] [address]  CBC with Differential     Status: Abnormal   Collection Time: 11/11/23  2:20 PM  Result Value Ref Range   WBC 8.1 4.0 - 10.5 K/uL   RBC 3.91 (L) 4.22 - 5.81 MIL/uL   Hemoglobin 12.1 (L) 13.0 - 17.0 g/dL   HCT 62.2 (L) 60.9 - 47.9 %   MCV 96.4 80.0 - 100.0 fL   MCH 30.9 26.0 - 34.0 pg   MCHC 32.1 30.0 - 36.0 g/dL   RDW 86.5 88.4 - 84.4 %   Platelets 226 150 - 400 K/uL   nRBC 0.0 0.0 - 0.2 %   Neutrophils Relative % 57 %   Neutro Abs 4.6 1.7 - 7.7 K/uL   Lymphocytes Relative 32 %   Lymphs Abs 2.6 0.7 - 4.0 K/uL   Monocytes Relative 9 %   Monocytes Absolute 0.8 0.1 - 1.0 K/uL   Eosinophils Relative 1 %   Eosinophils Absolute 0.1 0.0 - 0.5 K/uL   Basophils Relative 1 %   Basophils Absolute 0.0 0.0 - 0.1 K/uL   Immature Granulocytes 0 %   Abs Immature  Granulocytes 0.03 0.00 - 0.07 K/uL    Comment: Performed at [facility], [address] [place], [place] [address]  Ethanol     Status: Abnormal   Collection Time: 11/11/23  2:20 PM  Result Value Ref Range   Alcohol, Ethyl (B) 276 (H) <15 mg/dL    Comment: (NOTE) For medical purposes only. Performed at [facility], [address], [place], [place] [address]   Urine rapid drug screen (hosp performed)     Status: Abnormal   Collection Time: 11/11/23  7:05 PM  Result Value Ref Range   Opiates NONE DETECTED NONE DETECTED   Cocaine NONE DETECTED NONE DETECTED   Benzodiazepines NONE DETECTED NONE DETECTED   Amphetamines NONE DETECTED NONE DETECTED   Tetrahydrocannabinol POSITIVE (A) NONE DETECTED  Barbiturates NONE DETECTED NONE DETECTED    Comment: (NOTE) DRUG SCREEN FOR MEDICAL PURPOSES ONLY.  IF CONFIRMATION IS NEEDED FOR ANY PURPOSE, NOTIFY LAB WITHIN 5 DAYS.  LOWEST DETECTABLE LIMITS FOR URINE DRUG SCREEN Drug Class                     Cutoff (ng/mL) Amphetamine and metabolites    1000 Barbiturate and metabolites    200 Benzodiazepine                 200 Opiates and metabolites        300 Cocaine and metabolites        300 THC                            50 Performed at [facility], [address] [place], [place] [address]     Blood Alcohol level:  Lab Results  Component Value Date   ETH 276 (H) 11/11/2023    Metabolic Disorder Labs:  No results found for: HGBA1C, MPG No results found for: PROLACTIN No results found for: CHOL, TRIG, HDL, CHOLHDL, VLDL, LDLCALC  Current Medications: Current Facility-Administered Medications  Medication Dose Route Frequency Provider Last Rate Last Admin   acetaminophen  (TYLENOL ) tablet 650 mg  650 mg Oral Q6H PRN Weber, Kyra A, NP       alum & mag hydroxide-simeth (MAALOX/MYLANTA) 200-200-20 MG/5ML suspension 30 mL  30 mL Oral Q4H PRN Weber, Kyra A, NP        haloperidol  (HALDOL ) tablet 5 mg  5 mg Oral TID PRN Weber, Kyra A, NP       And   diphenhydrAMINE  (BENADRYL ) capsule 50 mg  50 mg Oral TID PRN Weber, Kyra A, NP       haloperidol  lactate (HALDOL ) injection 5 mg  5 mg Intramuscular TID PRN Weber, Kyra A, NP       And   diphenhydrAMINE  (BENADRYL ) injection 50 mg  50 mg Intramuscular TID PRN Weber, Kyra A, NP       And   LORazepam  (ATIVAN ) injection 2 mg  2 mg Intramuscular TID PRN Weber, Kyra A, NP       haloperidol  lactate (HALDOL ) injection 10 mg  10 mg Intramuscular TID PRN Weber, Kyra A, NP       And   diphenhydrAMINE  (BENADRYL ) injection 50 mg  50 mg Intramuscular TID PRN Weber, Kyra A, NP       And   LORazepam  (ATIVAN ) injection 2 mg  2 mg Intramuscular TID PRN Weber, Kyra A, NP       LORazepam  (ATIVAN ) tablet 0-4 mg  0-4 mg Oral Q6H Weber, Kyra A, NP       [START ON 11/14/2023] LORazepam  (ATIVAN ) tablet 0-4 mg  0-4 mg Oral Q12H Weber, Kyra A, NP       magnesium  hydroxide (MILK OF MAGNESIA) suspension 30 mL  30 mL Oral Daily PRN Weber, Kyra A, NP       ondansetron  (ZOFRAN ) tablet 4 mg  4 mg Oral Q8H PRN Weber, Kyra A, NP       progesterone  (PROMETRIUM ) capsule 200 mg  200 mg Oral Daily Weber, Kyra A, NP   200 mg at 11/13/23 0846   spironolactone  (ALDACTONE ) tablet 100 mg  100 mg Oral Daily Zouev, Dmitri, MD   100 mg at 11/13/23 0846   thiamine  (VITAMIN B1) tablet 100 mg  100 mg Oral Daily  Weber, Kyra A, NP   100 mg at 11/13/23 9153   Or   thiamine  (VITAMIN B1) injection 100 mg  100 mg Intravenous Daily Weber, Kyra A, NP       PTA Medications: Medications Prior to Admission  Medication Sig Dispense Refill Last Dose/Taking   amoxicillin -clavulanate (AUGMENTIN ) 875-125 MG tablet Take 1 tablet by mouth every 12 (twelve) hours. (Patient not taking: Reported on 11/12/2023) 20 tablet 0 Completed Course   estradiol  valerate (DELESTROGEN ) 20 MG/ML injection INJECT 0.3 MILLILITERS INTO THIGH EVERY WEEK AS DIRECTED BY DOCTOR 10 mL 2     lidocaine  (XYLOCAINE ) 2 % solution Use as directed 15 mLs in the mouth or throat as needed for mouth pain. (Patient not taking: Reported on 11/12/2023) 100 mL 0 Completed Course   progesterone  (PROMETRIUM ) 200 MG capsule Take 1 capsule (200 mg total) by mouth daily. 90 capsule 0    spironolactone  (ALDACTONE ) 100 MG tablet TAKE 1 TABLET BY MOUTH DAILY 90 tablet 0     Psychiatric Specialty Exam: Appearance: Appropriately dressed, good grooming and hygiene Eye Contact: Good Speech: Normal rate, rhythm, tone Mood: Depressed Affect: Flat Thought Process: Linear, goal-directed Thought Content: Reality-based Hallucinations: Denied Delusions/Paranoia: Denied Suicidal Ideation: Denied at time of interview Homicidal Ideation: Denied Orientation: Oriented x3 Memory: Good Concentration: Good Recall: Good Fund of Knowledge: Good Language: Intact Insight: Fair Judgment: Fair Psychomotor: No agitation or retardation noted   Musculoskeletal: Strength & Muscle Tone: within normal limits Gait & Station: normal  Physical Exam: Physical Exam Constitutional:      Appearance: Normal appearance.  HENT:     Head: Normocephalic.  Musculoskeletal:        General: Normal range of motion.     Cervical back: Normal range of motion.  Neurological:     Mental Status: She is alert.    ROS Blood pressure 102/67, pulse 60, temperature 97.7 F (36.5 C), resp. rate (!) 22, height 5' 5 (1.651 m), weight 64 kg, SpO2 100%. Body mass index is 23.46 kg/m.  Principal Diagnosis: Suicidal ideations Diagnosis:  Principal Problem:   Suicidal ideations   Clinical Decision Making: Patient meets DSM-5 diagnostic criteria for Major Depressive Disorder, recurrent, severe, without psychotic features based on persistent depressive symptoms including anhedonia, hopelessness, poor sleep, and social withdrawal. Also presenting with Alcohol Use Disorder, severe and cocaine use disorder in remission. Collateral  information and chart review support diagnosis. There is a significant history of self-harm through cutting behavior. She denies suicidal intent but presents with high symptom burden and functional impairment. Will begin Lexapro  10mg  to address depression and anxiety, plan to monitor in this setting adjusting plan of care as we move forward.    Safety and Monitoring:             -- Voluntary admission to inpatient psychiatric unit for safety, stabilization and treatment             -- Daily contact with patient to assess and evaluate symptoms and progress in treatment             -- Patient's case to be discussed in multi-disciplinary team meeting             -- Observation Level: q15 minute checks             -- Vital signs:  q12 hours             -- Precautions: suicide, elopement, and assault   2. Psychiatric Diagnoses and Treatment:  Major  Depressive Disorder, recurrent, severe, without psychotic features Attention-Deficit/Hyperactivity Disorder, unspecified (by history) Bulimia Nervosa (by history) Alcohol Use Disorder, severe Cannabis Use Disorder, mild Personal history of self-harm      Psychiatric Medications Started: Lexapro  10 mg PO daily CIWA protocol with Ativan  prn     -- The risks/benefits/side-effects/alternatives to this medication were discussed in detail with the patient and time was given for questions. The patient consents to medication trial.                -- Metabolic profile and EKG monitoring obtained while on an atypical antipsychotic (BMI: Lipid Panel: HbgA1c: QTc:)              -- Encouraged patient to participate in unit milieu and in scheduled group therapies                            3. Medical Issues Being Addressed: no needs identified      4. Discharge Planning:              -- Social work and case management to assist with discharge planning and identification of hospital follow-up needs prior to discharge             -- Estimated LOS: 5-7 days              -- Discharge Concerns: Need to establish a safety plan; Medication compliance and effectiveness             -- Discharge Goals: Return home with outpatient referrals follow ups  Physician Treatment Plan for Primary Diagnosis: Suicidal ideations Long Term Goal(s): Improvement in symptoms so as ready for discharge  Short Term Goals: Ability to identify changes in lifestyle to reduce recurrence of condition will improve  Physician Treatment Plan for Secondary Diagnosis: Principal Problem:   Suicidal ideations  Long Term Goal(s): Improvement in symptoms so as ready for discharge  Short Term Goals: Ability to identify changes in lifestyle to reduce recurrence of condition will improve  I certify that inpatient services furnished can reasonably be expected to improve the patient's condition.    Hoy CHRISTELLA Pinal, NP 8/8/20259:14 AM

## 2023-11-13 NOTE — Plan of Care (Signed)
   Problem: Education: Goal: Emotional status will improve Outcome: Progressing Goal: Mental status will improve Outcome: Progressing

## 2023-11-13 NOTE — Group Note (Signed)
 Date:  11/13/2023 Time:  11:31 AM  Group Topic/Focus:  Goals Group:   The focus of this group is to help patients establish daily goals to achieve during treatment and discuss how the patient can incorporate goal setting into their daily lives to aide in recovery.  Participation Level:  Did Not Attend  Hammad Finkler A Pessy Delamar 11/13/2023, 11:31 AM

## 2023-11-13 NOTE — Progress Notes (Signed)
 Nursing Shift Note:  1900-0700  Attended Evening Group: yes Medication Compliant:  yes Behavior: isolative, calm Sleep Quality: good Significant Changes: none.   11/13/23 2200  Psychosocial Assessment  Patient Complaints Depression;Anxiety  Eye Contact Brief  Facial Expression Sad  Affect Depressed  Speech Logical/coherent  Interaction Minimal  Motor Activity Slow  Appearance/Hygiene Unremarkable  Behavior Characteristics Guarded  Mood Depressed  Thought Process  Coherency WDL  Content Paranoia  Delusions Paranoid  Judgment Limited  Confusion None  Danger to Self  Current suicidal ideation? Denies  Danger to Others  Danger to Others None reported or observed

## 2023-11-13 NOTE — Progress Notes (Signed)
 Pt isolative and minimal with this Clinical research associate. Pt denies SI/HI/AVH. Pt given education, support, and encouragement to be active in her treatment plan. Pt being monitored Q 15 minutes for safety per unit protocol, remains safe on the unit

## 2023-11-13 NOTE — Plan of Care (Signed)

## 2023-11-13 NOTE — Group Note (Signed)
 Recreation Therapy Group Note   Group Topic:Leisure Education  Group Date: 11/13/2023 Start Time: 1040 End Time: 1140 Facilitators: Celestia Jeoffrey BRAVO, LRT, CTRS Location: Craft Room  Group Description: Leisure. Patients were given the option to choose from singing karaoke, coloring mandalas, using oil pastels, journaling, painting or playing with play-doh. LRT and pts discussed the meaning of leisure, the importance of participating in leisure during their free time/when they're outside of the hospital, as well as how our leisure interests can also serve as coping skills.   Goal Area(s) Addressed:  Patient will identify a current leisure interest.  Patient will learn the definition of "leisure". Patient will practice making a positive decision. Patient will have the opportunity to try a new leisure activity. Patient will communicate with peers and LRT.    Affect/Mood: Anxious and Flat   Participation Level: Active and Engaged   Participation Quality: Independent   Behavior: Appropriate and Calm   Speech/Thought Process: Coherent   Insight: Fair   Judgement: Fair    Modes of Intervention: Activity, Education, Exploration, and Music   Patient Response to Interventions:  Receptive   Education Outcome:  Acknowledges education   Clinical Observations/Individualized Feedback: Sharyne was active in their participation of session activities and group discussion. Pt identified walk in the woods and read as thing she does in her free time. Pt chose to read while in group.    Plan: Continue to engage patient in RT group sessions 2-3x/week.   Jeoffrey BRAVO Celestia, LRT, CTRS 11/13/2023 1:16 PM

## 2023-11-13 NOTE — BH IP Treatment Plan (Addendum)
 Interdisciplinary Treatment and Diagnostic Plan Update  11/13/2023 Time of Session: 10:13 AM Jesus Wilson MRN: [id number]  Principal Diagnosis: Suicidal ideations  Secondary Diagnoses: Principal Problem:   Suicidal ideations   Current Medications:  Current Facility-Administered Medications  Medication Dose Route Frequency Provider Last Rate Last Admin   acetaminophen  (TYLENOL ) tablet 650 mg  650 mg Oral Q6H PRN Weber, Kyra A, NP       alum & mag hydroxide-simeth (MAALOX/MYLANTA) 200-200-20 MG/5ML suspension 30 mL  30 mL Oral Q4H PRN Weber, Kyra A, NP       haloperidol  (HALDOL ) tablet 5 mg  5 mg Oral TID PRN Weber, Kyra A, NP       And   diphenhydrAMINE  (BENADRYL ) capsule 50 mg  50 mg Oral TID PRN Weber, Kyra A, NP       haloperidol  lactate (HALDOL ) injection 5 mg  5 mg Intramuscular TID PRN Weber, Kyra A, NP       And   diphenhydrAMINE  (BENADRYL ) injection 50 mg  50 mg Intramuscular TID PRN Weber, Kyra A, NP       And   LORazepam  (ATIVAN ) injection 2 mg  2 mg Intramuscular TID PRN Weber, Kyra A, NP       haloperidol  lactate (HALDOL ) injection 10 mg  10 mg Intramuscular TID PRN Weber, Kyra A, NP       And   diphenhydrAMINE  (BENADRYL ) injection 50 mg  50 mg Intramuscular TID PRN Weber, Kyra A, NP       And   LORazepam  (ATIVAN ) injection 2 mg  2 mg Intramuscular TID PRN Weber, Kyra A, NP       LORazepam  (ATIVAN ) tablet 0-4 mg  0-4 mg Oral Q6H Weber, Kyra A, NP       [START ON 11/14/2023] LORazepam  (ATIVAN ) tablet 0-4 mg  0-4 mg Oral Q12H Weber, Kyra A, NP       magnesium  hydroxide (MILK OF MAGNESIA) suspension 30 mL  30 mL Oral Daily PRN Weber, Kyra A, NP       ondansetron  (ZOFRAN ) tablet 4 mg  4 mg Oral Q8H PRN Weber, Kyra A, NP       progesterone  (PROMETRIUM ) capsule 200 mg  200 mg Oral Daily Weber, Kyra A, NP   200 mg at 11/13/23 0846   spironolactone  (ALDACTONE ) tablet 100 mg  100 mg Oral Daily Zouev, Dmitri, MD   100 mg at 11/13/23 0846   thiamine  (VITAMIN B1) tablet 100 mg  100 mg  Oral Daily Weber, Kyra A, NP   100 mg at 11/13/23 0846   Or   thiamine  (VITAMIN B1) injection 100 mg  100 mg Intravenous Daily Weber, Kyra A, NP       PTA Medications: Medications Prior to Admission  Medication Sig Dispense Refill Last Dose/Taking   amoxicillin -clavulanate (AUGMENTIN ) 875-125 MG tablet Take 1 tablet by mouth every 12 (twelve) hours. (Patient not taking: Reported on 11/12/2023) 20 tablet 0 Completed Course   estradiol  valerate (DELESTROGEN ) 20 MG/ML injection INJECT 0.3 MILLILITERS INTO THIGH EVERY WEEK AS DIRECTED BY DOCTOR 10 mL 2    lidocaine  (XYLOCAINE ) 2 % solution Use as directed 15 mLs in the mouth or throat as needed for mouth pain. (Patient not taking: Reported on 11/12/2023) 100 mL 0 Completed Course   progesterone  (PROMETRIUM ) 200 MG capsule Take 1 capsule (200 mg total) by mouth daily. 90 capsule 0    spironolactone  (ALDACTONE ) 100 MG tablet TAKE 1 TABLET BY MOUTH DAILY 90 tablet 0  Patient Stressors: Occupational concerns   Substance abuse    Patient Strengths: Ability for insight  Capable of independent living  Printmaker for treatment/growth  Work skills   Treatment Modalities: Medication Management, Group therapy, Case management,  1 to 1 session with clinician, Psychoeducation, Recreational therapy.   Physician Treatment Plan for Primary Diagnosis: Suicidal ideations Long Term Goal(s): Improvement in symptoms so as ready for discharge   Short Term Goals: Ability to identify changes in lifestyle to reduce recurrence of condition will improve  Medication Management: Evaluate patient's response, side effects, and tolerance of medication regimen.  Therapeutic Interventions: 1 to 1 sessions, Unit Group sessions and Medication administration.  Evaluation of Outcomes: Not Met  Physician Treatment Plan for Secondary Diagnosis: Principal Problem:   Suicidal ideations  Long Term Goal(s): Improvement in symptoms so as ready for  discharge   Short Term Goals: Ability to identify changes in lifestyle to reduce recurrence of condition will improve     Medication Management: Evaluate patient's response, side effects, and tolerance of medication regimen.  Therapeutic Interventions: 1 to 1 sessions, Unit Group sessions and Medication administration.  Evaluation of Outcomes: Not Met   RN Treatment Plan for Primary Diagnosis: Suicidal ideations Long Term Goal(s): Knowledge of disease and therapeutic regimen to maintain health will improve  Short Term Goals: Ability to verbalize frustration and anger appropriately will improve, Ability to demonstrate self-control, Ability to participate in decision making will improve, Ability to verbalize feelings will improve, Ability to disclose and discuss suicidal ideas, and Ability to identify and develop effective coping behaviors will improve  Medication Management: RN will administer medications as ordered by provider, will assess and evaluate patient's response and provide education to patient for prescribed medication. RN will report any adverse and/or side effects to prescribing provider.  Therapeutic Interventions: 1 on 1 counseling sessions, Psychoeducation, Medication administration, Evaluate responses to treatment, Monitor vital signs and CBGs as ordered, Perform/monitor CIWA, COWS, AIMS and Fall Risk screenings as ordered, Perform wound care treatments as ordered.  Evaluation of Outcomes: Not Met   LCSW Treatment Plan for Primary Diagnosis: Suicidal ideations Long Term Goal(s): Safe transition to appropriate next level of care at discharge, Engage patient in therapeutic group addressing interpersonal concerns.  Short Term Goals: Engage patient in aftercare planning with referrals and resources, Increase social support, Increase ability to appropriately verbalize feelings, Increase emotional regulation, Facilitate acceptance of mental health diagnosis and concerns,  Facilitate patient progression through stages of change regarding substance use diagnoses and concerns, Identify triggers associated with mental health/substance abuse issues, and Increase skills for wellness and recovery  Therapeutic Interventions: Assess for all discharge needs, 1 to 1 time with Social worker, Explore available resources and support systems, Assess for adequacy in community support network, Educate family and significant other(s) on suicide prevention, Complete Psychosocial Assessment, Interpersonal group therapy.  Evaluation of Outcomes: Not Met   Progress in Treatment: Attending groups: Yes. and No. Participating in groups: Yes. and No. Taking medication as prescribed: Yes. Toleration medication: Yes. Family/Significant other contact made: No, will contact:  CSW to contact once permission is granted.  Patient understands diagnosis: Yes. Discussing patient identified problems/goals with staff: Yes. Medical problems stabilized or resolved: Yes. Denies suicidal/homicidal ideation: Yes. Issues/concerns per patient self-inventory: No. Other: None  New problem(s) identified: No, Describe:  None  New Short Term/Long Term Goal(s):detox, elimination of symptoms of psychosis, medication management for mood stabilization; elimination of SI thoughts; development of comprehensive mental wellness/sobriety plan.  Patient Goals:  I just want to feel happy. I want to feel there's a point to waking up.  Discharge Plan or Barriers: CSW to assist with the development of appropriate discharge plan.    Reason for Continuation of Hospitalization: Anxiety Depression Suicidal ideation  Estimated Length of Stay: 1-7 days.  Last 3 [place] Suicide Severity Risk Score: Flowsheet Row Admission (Current) from 11/12/2023 in [facility] INPATIENT BEHAVIORAL MEDICINE ED from 11/11/2023 in [facility] Emergency Department at [facility] ED from 05/16/2023 in [facility] Emergency Department at  [facility]  C-SSRS RISK CATEGORY High Risk High Risk No Risk    Last PHQ 2/9 Scores:    05/21/2023    1:57 PM 10/08/2022   10:56 AM 09/25/2021   11:03 AM  Depression screen PHQ 2/9  Decreased Interest 1 1 0  Down, Depressed, Hopeless 1 1 0  PHQ - 2 Score 2 2 0  Altered sleeping 3 3 0  Tired, decreased energy 3 2 0  Change in appetite 3 2 0  Feeling bad or failure about yourself  1 1 2   Trouble concentrating 3 0 0  Moving slowly or fidgety/restless 0 1 0  Suicidal thoughts 0 0 0  PHQ-9 Score 15 11 2   Difficult doing work/chores   Somewhat difficult    Scribe for Treatment Team: Alveta CHRISTELLA Kerns, LCSW 11/13/2023 11:02 AM

## 2023-11-13 NOTE — Plan of Care (Signed)
   Problem: Education: Goal: Knowledge of [facility] Education information/materials will improve Outcome: Progressing Goal: Emotional status will improve Outcome: Progressing Goal: Mental status will improve Outcome: Progressing Goal: Verbalization of understanding the information provided will improve Outcome: Progressing

## 2023-11-13 NOTE — Progress Notes (Signed)
   11/13/23 0900  Psych Admission Type (Psych Patients Only)  Admission Status Involuntary  Psychosocial Assessment  Patient Complaints Depression;Anxiety  Eye Contact Brief  Facial Expression Sad  Affect Depressed  Speech Logical/coherent;Other (Comment) (Patient has a stutter)  Interaction Minimal  Motor Activity Slow  Appearance/Hygiene Unremarkable  Behavior Characteristics Cooperative;Guarded  Mood Depressed;Pleasant  Thought Process  Coherency WDL  Content Paranoia  Delusions Paranoid  Perception Hallucinations (Reports seeing people out of the corner of her eyes.)  Hallucination Visual  Judgment Poor  Confusion None  Danger to Self  Current suicidal ideation? Denies  Self-Injurious Behavior Self-injurious ideation verbalized (States that this is passive and that she has always felt that way.)  Agreement Not to Harm Self Yes  Description of Agreement Verbal  Danger to Others  Danger to Others None reported or observed   Jesus Wilson states that she slept better than she has in a very long time. Reports that she is feeling better due to being able to take a break from life, and states that she wants to move forward with an antidepressant. Had the goal for the day to be more social, and was observed to be more present in the milieu, participating in groups, and attending the outdoor group. However, she became despondent due to disparaging comments that peers made (not directly at [name], but to anyone who would listen) about transgender individuals. Jesus Wilson was spoken to by several staff members about this, and several extra check-ins were made to lift her spirits. Jesus Wilson states that she is OK, but wants to be left alone at this time.  Withdrawal symptoms minimal, and Jesus Wilson states that she does not feel them any longer. She does endorse some nausea, but feels that it is related to anxiety moreso than withdrawal. Denies SI. Endorses passive SH, and states that is her baseline--agreed to  talk with staff and seek help rather than self-harm.

## 2023-11-14 NOTE — Group Note (Signed)
 Date:  11/14/2023 Time:  9:41 PM  Group Topic/Focus:  Recovery Goals:   The focus of this group is to identify appropriate goals for recovery and establish a plan to achieve them.    Participation Level:  Active  Participation Quality:  Appropriate  Affect:  Appropriate  Cognitive:  Appropriate  Insight: Appropriate  Engagement in Group:  Engaged  Modes of Intervention:  Discussion  Additional Comments:    Makaylin Carlo L 11/14/2023, 9:41 PM

## 2023-11-14 NOTE — Progress Notes (Signed)
 [facility] MD Progress Note  11/14/2023 12:28 PM Jesus Wilson  MRN:  [id number]  Jesus Wilson is a 33 yo transgender male to male who presents to this setting following ED appearance being brought in by law enforcement related to concern for SI. Patient has past psychiatric history of ADHD and bulimia and past medical history of stuttering.    Per Chart Review Urine drug screen was positive for tetrahydrocannabinol. Blood alcohol level was elevated (>76) at time of admission. CMP was unremarkable except for BUN 23 and calcium 8.4. EKG performed on 11/11/23 noted QTc 422.   Subjective:  Chart reviewed, case discussed in multidisciplinary meeting, patient seen during rounds.   Patient seen today for follow-up psychiatric evaluation. She remains in bed and appears disheveled. She reports significant depression rated 7/10 and anxiety rated 4/10. Denies suicidal ideation, homicidal ideation, auditory or visual hallucinations, paranoia, or delusions. Reports no self-harm or aggressive behaviors since last assessment. States she is tolerating the initiation of Lexapro  5 mg without side effects. She notes this is her first trial of Lexapro  for depressive symptoms. Reports sleep good and appetite good.    Past Psychiatric History: see h&P Family History: History reviewed. No pertinent family history. Social History:  Social History   Substance and Sexual Activity  Alcohol Use Yes   Comment: Patient reports 3/4ths of a 5th of liquor a day     Social History   Substance and Sexual Activity  Drug Use Yes   Types: Marijuana    Social History   Socioeconomic History   Marital status: Single    Spouse name: Not on file   Number of children: Not on file   Years of education: Not on file   Highest education level: 12th grade  Occupational History   Not on file  Tobacco Use   Smoking status: Former    Types: Cigarettes   Smokeless tobacco: Not on file   Tobacco comments:    Vaping only; no longer  smoking cigarettes  Vaping Use   Vaping status: Former  Substance and Sexual Activity   Alcohol use: Yes    Comment: Patient reports 3/4ths of a 5th of liquor a day   Drug use: Yes    Types: Marijuana   Sexual activity: Not Currently  Other Topics Concern   Not on file  Social History Narrative   Not on file   Social Drivers of Health   Financial Resource Strain: High Risk (10/04/2022)   Overall Financial Resource Strain (CARDIA)    Difficulty of Paying Living Expenses: Hard  Food Insecurity: No Food Insecurity (11/12/2023)   Hunger Vital Sign    Worried About Running Out of Food in the Last Year: Never true    Ran Out of Food in the Last Year: Never true  Transportation Needs: Unmet Transportation Needs (11/12/2023)   PRAPARE - Transportation    Lack of Transportation (Medical): Yes    Lack of Transportation (Non-Medical): Yes  Physical Activity: Sufficiently Active (10/04/2022)   Exercise Vital Sign    Days of Exercise per Week: 4 days    Minutes of Exercise per Session: 70 min  Stress: Stress Concern Present (10/04/2022)   [employer] of Occupational Health - Occupational Stress Questionnaire    Feeling of Stress : Rather much  Social Connections: Socially Isolated (10/04/2022)   Social Connection and Isolation Panel    Frequency of Communication with Friends and Family: Once a week    Frequency of Social Gatherings with Friends and  Family: Once a week    Attends Religious Services: Never    Database administrator or Organizations: No    Attends Engineer, structural: Not on file    Marital Status: Never married   Past Medical History:  Past Medical History:  Diagnosis Date   Seizures (HCC)    Stutter     Past Surgical History:  Procedure Laterality Date   NO PAST SURGERIES      Current Medications: Current Facility-Administered Medications  Medication Dose Route Frequency Provider Last Rate Last Admin   acetaminophen  (TYLENOL ) tablet 650 mg  650 mg  Oral Q6H PRN Weber, Kyra A, NP       alum & mag hydroxide-simeth (MAALOX/MYLANTA) 200-200-20 MG/5ML suspension 30 mL  30 mL Oral Q4H PRN Weber, Kyra A, NP   30 mL at 11/13/23 1727   haloperidol  (HALDOL ) tablet 5 mg  5 mg Oral TID PRN Weber, Kyra A, NP       And   diphenhydrAMINE  (BENADRYL ) capsule 50 mg  50 mg Oral TID PRN Weber, Kyra A, NP       haloperidol  lactate (HALDOL ) injection 5 mg  5 mg Intramuscular TID PRN Weber, Kyra A, NP       And   diphenhydrAMINE  (BENADRYL ) injection 50 mg  50 mg Intramuscular TID PRN Weber, Kyra A, NP       And   LORazepam  (ATIVAN ) injection 2 mg  2 mg Intramuscular TID PRN Weber, Kyra A, NP       haloperidol  lactate (HALDOL ) injection 10 mg  10 mg Intramuscular TID PRN Weber, Kyra A, NP       And   diphenhydrAMINE  (BENADRYL ) injection 50 mg  50 mg Intramuscular TID PRN Weber, Kyra A, NP       And   LORazepam  (ATIVAN ) injection 2 mg  2 mg Intramuscular TID PRN Weber, Kyra A, NP       escitalopram  (LEXAPRO ) tablet 5 mg  5 mg Oral Daily Cleotilde Hoy HERO, NP   5 mg at 11/14/23 9191   LORazepam  (ATIVAN ) tablet 0-4 mg  0-4 mg Oral Q12H Weber, Kyra A, NP       magnesium  hydroxide (MILK OF MAGNESIA) suspension 30 mL  30 mL Oral Daily PRN Weber, Kyra A, NP       ondansetron  (ZOFRAN -ODT) disintegrating tablet 4 mg  4 mg Oral Q8H PRN Dail Rankin RAMAN, RPH   4 mg at 11/13/23 2156   progesterone  (PROMETRIUM ) capsule 200 mg  200 mg Oral Daily Weber, Kyra A, NP   200 mg at 11/14/23 0809   spironolactone  (ALDACTONE ) tablet 100 mg  100 mg Oral Daily Zouev, Dmitri, MD   100 mg at 11/14/23 0809   thiamine  (VITAMIN B1) tablet 100 mg  100 mg Oral Daily Weber, Kyra A, NP   100 mg at 11/14/23 0808   Or   thiamine  (VITAMIN B1) injection 100 mg  100 mg Intravenous Daily Weber, Kyra A, NP        Lab Results: No results found for this or any previous visit (from the past 48 hours).  Blood Alcohol level:  Lab Results  Component Value Date   ETH 276 (H) 11/11/2023     Metabolic Disorder Labs: No results found for: HGBA1C, MPG No results found for: PROLACTIN No results found for: CHOL, TRIG, HDL, CHOLHDL, VLDL, LDLCALC    Psychiatric Specialty Exam: Appearance: Disheveled Behavior: Calm, cooperative, limited engagement Eye Contact: Fair Speech: Normal rate, rhythm,  and volume Mood: Depressed Affect: Blunted Thought Process: Linear, goal-directed Thought Content: No delusions, paranoia, or hallucinations observed Perception: No perceptual disturbances noted Cognition: Alert and oriented to person, place, time, and situation Attention/Concentration: Intact Memory: Grossly intact Insight: Fair Judgment: Fair Sleep: good  Appetite: good  Musculoskeletal: Strength & Muscle Tone: within normal limits Gait & Station: normal    Physical Exam: Physical Exam ROS Blood pressure 110/69, pulse 63, temperature 97.7 F (36.5 C), resp. rate 17, height 5' 5 (1.651 m), weight 64 kg, SpO2 100%. Body mass index is 23.46 kg/m.  Diagnosis: Principal Problem:   Suicidal ideations   PLAN: Safety and Monitoring:  -- Voluntary admission to inpatient psychiatric unit for safety, stabilization and treatment  -- Daily contact with patient to assess and evaluate symptoms and progress in treatment  -- Patient's case to be discussed in multi-disciplinary team meeting  -- Observation Level : q15 minute checks  -- Vital signs:  q12 hours  -- Precautions: suicide, elopement, and assault -- Encouraged patient to participate in unit milieu and in scheduled group therapies  2. Psychiatric Diagnoses and Treatment:   Patient meets DSM-5 diagnostic criteria for Major Depressive Disorder, recurrent, severe, without psychotic features based on persistent depressive symptoms including anhedonia, hopelessness, poor sleep, and social withdrawal. Also presenting with Alcohol Use Disorder, severe and cocaine use disorder in remission. Collateral  information and chart review support diagnosis. There is a significant history of self-harm through cutting behavior. She denies suicidal intent but presents with high symptom burden and functional impairment. Will begin Lexapro  10mg  to address depression and anxiety, plan to monitor in this setting adjusting plan of care as we move forward.    Major Depressive Disorder, recurrent, severe, without psychotic features Attention-Deficit/Hyperactivity Disorder, unspecified (by history) Bulimia Nervosa (by history) Alcohol Use Disorder, severe Cannabis Use Disorder, mild Personal history of self-harm       Psychiatric Medications Started: Lexapro  10 mg PO daily CIWA protocol with Ativan  prn     -- The risks/benefits/side-effects/alternatives to this medication were discussed in detail with the patient and time was given for questions. The patient consents to medication trial.                -- Metabolic profile and EKG monitoring obtained while on an atypical antipsychotic (BMI: Lipid Panel: HbgA1c: QTc:)              -- Encouraged patient to participate in unit milieu and in scheduled group therapies                            3. Medical Issues Being Addressed: no needs identified       4. Discharge Planning:   -- Social work and case management to assist with discharge planning and identification of hospital follow-up needs prior to discharge  -- Estimated LOS: 3-4 days  Hoy CHRISTELLA [place], NP 11/14/2023, 12:28 PM

## 2023-11-14 NOTE — Plan of Care (Signed)
  Problem: Education: Goal: Knowledge of [facility] Education information/materials will improve Outcome: Progressing Goal: Verbalization of understanding the information provided will improve Outcome: Progressing   Problem: Coping: Goal: Ability to verbalize frustrations and anger appropriately will improve Outcome: Progressing Goal: Ability to demonstrate self-control will improve Outcome: Progressing

## 2023-11-14 NOTE — Progress Notes (Signed)
   11/14/23 0800  Psychosocial Assessment  Patient Complaints Depression;Anxiety  Eye Contact Brief  Facial Expression Sad  Affect Depressed  Speech Logical/coherent  Interaction Minimal  Motor Activity Slow  Appearance/Hygiene Unremarkable  Behavior Characteristics Guarded  Mood Depressed  Thought Process  Coherency WDL  Content Paranoia  Delusions Paranoid  Perception Hallucinations  Hallucination Visual  Judgment Limited  Confusion None  Danger to Self  Current suicidal ideation? Denies  Self-Injurious Behavior No self-injurious ideation or behavior indicators observed or expressed   Agreement Not to Harm Self Yes  Description of Agreement verba  Danger to Others  Danger to Others None reported or observed

## 2023-11-15 MED ORDER — PROPRANOLOL HCL 20 MG PO TABS
10.0000 mg | ORAL_TABLET | Freq: Two times a day (BID) | ORAL | Status: DC | PRN
  Administered 2023-11-15 – 2023-11-16 (×3): 10 mg via ORAL
  Filled 2023-11-15 (×2): qty 1

## 2023-11-15 NOTE — Progress Notes (Signed)
 [facility] MD Progress Note  11/15/2023 12:49 PM Lewie Deman  MRN:  [id number]  Lyonel Morejon is a 33 yo transgender male to male who presents to this setting following ED appearance being brought in by law enforcement related to concern for SI. Patient has past psychiatric history of ADHD and bulimia and past medical history of stuttering.    Per Chart Review Urine drug screen was positive for tetrahydrocannabinol. Blood alcohol level was elevated (>76) at time of admission. CMP was unremarkable except for BUN 23 and calcium 8.4. EKG performed on 11/11/23 noted QTc 422.   Subjective:  Chart reviewed, case discussed in multidisciplinary meeting, patient seen during rounds.   Patient seen today for follow-up psychiatric evaluation. She reports her depression remains high. She states she has been isolating to her room and does not feel like eating. Endorses passive suicidal ideation without plan or intent. Reports anxiety with restlessness and palpitations. Denies auditory or visual hallucinations, paranoia, or delusions. Denies homicidal ideation.   11/14/23: Patient seen today for follow-up psychiatric evaluation. She remains in bed and appears disheveled. She reports significant depression rated 7/10 and anxiety rated 4/10. Denies suicidal ideation, homicidal ideation, auditory or visual hallucinations, paranoia, or delusions. Reports no self-harm or aggressive behaviors since last assessment. States she is tolerating the initiation of Lexapro  5 mg without side effects. She notes this is her first trial of Lexapro  for depressive symptoms. Reports sleep good and appetite good.    Past Psychiatric History: see h&P Family History: History reviewed. No pertinent family history. Social History:  Social History   Substance and Sexual Activity  Alcohol Use Yes   Comment: Patient reports 3/4ths of a 5th of liquor a day     Social History   Substance and Sexual Activity  Drug Use Yes   Types: Marijuana     Social History   Socioeconomic History   Marital status: Single    Spouse name: Not on file   Number of children: Not on file   Years of education: Not on file   Highest education level: 12th grade  Occupational History   Not on file  Tobacco Use   Smoking status: Former    Types: Cigarettes   Smokeless tobacco: Not on file   Tobacco comments:    Vaping only; no longer smoking cigarettes  Vaping Use   Vaping status: Former  Substance and Sexual Activity   Alcohol use: Yes    Comment: Patient reports 3/4ths of a 5th of liquor a day   Drug use: Yes    Types: Marijuana   Sexual activity: Not Currently  Other Topics Concern   Not on file  Social History Narrative   Not on file   Social Drivers of Health   Financial Resource Strain: High Risk (10/04/2022)   Overall Financial Resource Strain (CARDIA)    Difficulty of Paying Living Expenses: Hard  Food Insecurity: No Food Insecurity (11/12/2023)   Hunger Vital Sign    Worried About Running Out of Food in the Last Year: Never true    Ran Out of Food in the Last Year: Never true  Transportation Needs: Unmet Transportation Needs (11/12/2023)   PRAPARE - Transportation    Lack of Transportation (Medical): Yes    Lack of Transportation (Non-Medical): Yes  Physical Activity: Sufficiently Active (10/04/2022)   Exercise Vital Sign    Days of Exercise per Week: 4 days    Minutes of Exercise per Session: 70 min  Stress: Stress Concern Present (10/04/2022)  [employer] of Occupational Health - Occupational Stress Questionnaire    Feeling of Stress : Rather much  Social Connections: Socially Isolated (10/04/2022)   Social Connection and Isolation Panel    Frequency of Communication with Friends and Family: Once a week    Frequency of Social Gatherings with Friends and Family: Once a week    Attends Religious Services: Never    Database administrator or Organizations: No    Attends Engineer, structural: Not on file     Marital Status: Never married   Past Medical History:  Past Medical History:  Diagnosis Date   Seizures (HCC)    Stutter     Past Surgical History:  Procedure Laterality Date   NO PAST SURGERIES      Current Medications: Current Facility-Administered Medications  Medication Dose Route Frequency Provider Last Rate Last Admin   acetaminophen  (TYLENOL ) tablet 650 mg  650 mg Oral Q6H PRN Weber, Kyra A, NP       alum & mag hydroxide-simeth (MAALOX/MYLANTA) 200-200-20 MG/5ML suspension 30 mL  30 mL Oral Q4H PRN Weber, Kyra A, NP   30 mL at 11/13/23 1727   haloperidol  (HALDOL ) tablet 5 mg  5 mg Oral TID PRN Weber, Kyra A, NP       And   diphenhydrAMINE  (BENADRYL ) capsule 50 mg  50 mg Oral TID PRN Weber, Kyra A, NP       haloperidol  lactate (HALDOL ) injection 5 mg  5 mg Intramuscular TID PRN Weber, Kyra A, NP       And   diphenhydrAMINE  (BENADRYL ) injection 50 mg  50 mg Intramuscular TID PRN Weber, Kyra A, NP       And   LORazepam  (ATIVAN ) injection 2 mg  2 mg Intramuscular TID PRN Weber, Kyra A, NP       haloperidol  lactate (HALDOL ) injection 10 mg  10 mg Intramuscular TID PRN Weber, Kyra A, NP       And   diphenhydrAMINE  (BENADRYL ) injection 50 mg  50 mg Intramuscular TID PRN Weber, Kyra A, NP       And   LORazepam  (ATIVAN ) injection 2 mg  2 mg Intramuscular TID PRN Weber, Kyra A, NP       escitalopram  (LEXAPRO ) tablet 5 mg  5 mg Oral Daily Cleotilde Hoy HERO, NP   5 mg at 11/15/23 0846   LORazepam  (ATIVAN ) tablet 0-4 mg  0-4 mg Oral Q12H Weber, Kyra A, NP   1 mg at 11/14/23 1802   magnesium  hydroxide (MILK OF MAGNESIA) suspension 30 mL  30 mL Oral Daily PRN Weber, Kyra A, NP       ondansetron  (ZOFRAN -ODT) disintegrating tablet 4 mg  4 mg Oral Q8H PRN Belue, Nathan S, RPH   4 mg at 11/13/23 2156   progesterone  (PROMETRIUM ) capsule 200 mg  200 mg Oral Daily Weber, Kyra A, NP   200 mg at 11/15/23 0845   propranolol  (INDERAL ) tablet 10 mg  10 mg Oral BID PRN Cleotilde Hoy HERO, NP        spironolactone  (ALDACTONE ) tablet 100 mg  100 mg Oral Daily Zouev, Dmitri, MD   100 mg at 11/15/23 0849   thiamine  (VITAMIN B1) tablet 100 mg  100 mg Oral Daily Weber, Kyra A, NP   100 mg at 11/15/23 0846   Or   thiamine  (VITAMIN B1) injection 100 mg  100 mg Intravenous Daily Weber, Efrain LABOR, NP        Lab Results:  No results found for this or any previous visit (from the past 48 hours).  Blood Alcohol level:  Lab Results  Component Value Date   ETH 276 (H) 11/11/2023    Metabolic Disorder Labs: No results found for: HGBA1C, MPG No results found for: PROLACTIN No results found for: CHOL, TRIG, HDL, CHOLHDL, VLDL, LDLCALC    Psychiatric Specialty Exam: Appearance: Flat affect, dysphoric mood, avoidant eye contact Speech: Normal rate, tone, and volume Thought Process: Linear, goal-directed Thought Content: No delusions, hallucinations, or paranoia observed/reported Mood: Dysphoric Affect: Flat Insight: Fair Judgment: Guarded Orientation: Fully oriented to person, place, time, and situation Memory: Intact Concentration: Adequate for conversation Recall: Intact Fund of Knowledge: Appropriate for education level Language: Fluent, appropriate Psychomotor: No abnormal movements noted Sleep: Not addressed today Appetite: Decreased Suicidal Thoughts: Passive SI without plan/intent Homicidal Thoughts: Denies  Musculoskeletal: Strength & Muscle Tone: within normal limits Gait & Station: normal    Physical Exam: Physical Exam ROS Blood pressure 117/80, pulse 65, temperature 97.7 F (36.5 C), resp. rate 16, height 5' 5 (1.651 m), weight 64 kg, SpO2 100%. Body mass index is 23.46 kg/m.  Diagnosis: Principal Problem:   Suicidal ideations   PLAN: Safety and Monitoring:  -- Voluntary admission to inpatient psychiatric unit for safety, stabilization and treatment  -- Daily contact with patient to assess and evaluate symptoms and progress in treatment  --  Patient's case to be discussed in multi-disciplinary team meeting  -- Observation Level : q15 minute checks  -- Vital signs:  q12 hours  -- Precautions: suicide, elopement, and assault -- Encouraged patient to participate in unit milieu and in scheduled group therapies  2. Psychiatric Diagnoses and Treatment:   Patient meets DSM-5 diagnostic criteria for Major Depressive Disorder, recurrent, severe, without psychotic features based on persistent depressive symptoms including anhedonia, hopelessness, poor sleep, and social withdrawal. Also presenting with Alcohol Use Disorder, severe and cocaine use disorder in remission. Collateral information and chart review support diagnosis. There is a significant history of self-harm through cutting behavior. She denies suicidal intent but presents with high symptom burden and functional impairment. Will begin Lexapro  10mg  to address depression and anxiety, plan to monitor in this setting adjusting plan of care as we move forward.   Continue inpatient level of care for safety, symptom stabilization, and medication management. Propranolol  10 mg PO BID PRN initiated for anxiety/restlessness with palpitations. Will continue to monitor for response and tolerability. Continue supportive interventions and encourage participation in groups and milieu therapy. Suicide precautions maintained.   Major Depressive Disorder, recurrent, severe, without psychotic features Attention-Deficit/Hyperactivity Disorder, unspecified (by history) Bulimia Nervosa (by history) Alcohol Use Disorder, severe Cannabis Use Disorder, mild Personal history of self-harm       Psychiatric Medications Started: Lexapro  10 mg PO daily  Propranolol  10 mg PO BID PRN - new order today CIWA protocol with Ativan  prn     -- The risks/benefits/side-effects/alternatives to this medication were discussed in detail with the patient and time was given for questions. The patient consents to medication  trial.                -- Metabolic profile and EKG monitoring obtained while on an atypical antipsychotic (BMI: Lipid Panel: HbgA1c: QTc:)              -- Encouraged patient to participate in unit milieu and in scheduled group therapies  3. Medical Issues Being Addressed: no needs identified       4. Discharge Planning:   -- Social work and case management to assist with discharge planning and identification of hospital follow-up needs prior to discharge  -- Estimated LOS: 3-4 days  Hoy CHRISTELLA Pinal, NP 11/15/2023, 12:49 PM

## 2023-11-15 NOTE — Group Note (Signed)
 Date:  11/15/2023 Time:  9:29 PM  Group Topic/Focus:  Wrap-Up Group:   The focus of this group is to help patients review their daily goal of treatment and discuss progress on daily workbooks.    Participation Level:  Minimal  Participation Quality:  Appropriate and Attentive  Affect:  Appropriate  Cognitive:  Alert and Appropriate  Insight: Appropriate  Engagement in Group:  Limited and Resistant  Modes of Intervention:  Discussion and Orientation  Additional Comments:     Maglione,Jamyra Zweig E 11/15/2023, 9:29 PM

## 2023-11-15 NOTE — Group Note (Signed)
 Date:  11/15/2023 Time:  4:56 PM  Group Topic/Focus:  Healthy Communication:   The focus of this group is to discuss communication, barriers to communication, as well as healthy ways to communicate with others.    Participation Level:  None  Participation Quality:  Inattentive and Resistant  Affect:  Flat and Resistant  Cognitive:  Alert  Insight: None  Engagement in Group:  Poor  Modes of Intervention:  Activity and Discussion  Additional Comments:  n/a  Nechelle Petrizzo 11/15/2023, 4:56 PM

## 2023-11-15 NOTE — Progress Notes (Signed)
 Pt noted to be visible in the dayroom but withdrawn and not engaging and interacting with peers.  Pt informed staff that her visit with her mother went well and that her mother is supportive.  Pt expressed depression and anxiety both rated severe.  When talking about stressors, Pt explained "I just feel that there is no reason to wake up.  I just work and nothing to show for it.  I have placed several applications since I left high school, the only job I can get is working at [employer] earning minimal wages.  I live with my mother and we share responsibility.  I don't see anything getting better.  I feel trapped. I hope to get some kind of assistance that I don't have to work hard so damn much to physically kill myself.  Being here is the first vacation I have had since I graduated and starting working." She continues to endorsed SI and feeling of hopelessness but contracted for safety, and denied plan and intent.  No noted SIB.   11/14/23 2200  Psychosocial Assessment  Patient Complaints Anxiety;Depression  Eye Contact Fair  Facial Expression Anxious;Flat  Affect Anxious;Depressed  Speech Logical/coherent  Interaction Minimal  Motor Activity Other (Comment) (WDL)  Appearance/Hygiene Unremarkable  Behavior Characteristics Cooperative;Appropriate to situation;Anxious  Mood Depressed;Anxious  Thought Process  Coherency WDL  Content WDL  Delusions None reported or observed  Perception WDL  Hallucination None reported or observed  Judgment Limited  Confusion None  Danger to Self  Current suicidal ideation? Denies  Agreement Not to Harm Self Yes  Description of Agreement Verbal  Danger to Others  Danger to Others None reported or observed   Problem: Education: Goal: Knowledge of [place] [facility] Education information/materials will improve Outcome: Progressing Goal: Emotional status will improve Outcome: Progressing Goal: Mental status will improve Outcome: Progressing Goal:  Verbalization of understanding the information provided will improve Outcome: Progressing   Problem: Activity: Goal: Interest or engagement in activities will improve Outcome: Progressing Goal: Sleeping patterns will improve Outcome: Progressing   Problem: Coping: Goal: Ability to verbalize frustrations and anger appropriately will improve Outcome: Progressing Goal: Ability to demonstrate self-control will improve Outcome: Progressing

## 2023-11-15 NOTE — Care Plan (Incomplete)
***   PT CLOTHING WAS FOUND BY EVS****

## 2023-11-15 NOTE — Group Note (Signed)
 Date:  11/15/2023 Time:  4:59 PM  Group Topic/Focus:  Activity Group: The focus of the group is to promote activity for the patients and to encourage them to go outside to the courtyard for some fresh air and some exercise.    Participation Level:  Did Not Attend  Jesus Wilson 11/15/2023, 4:59 PM

## 2023-11-15 NOTE — Care Plan (Incomplete Revision)
 Pt was moved from room 10 to room 6. Pt was moved to this room before EVS was able to come clean the room and bed A. So pt was moved to bed B and EVS arrived to unit around 8pm. After EVS cleaned bed A and left the unit the pt said that her brown bag with her clothing was taken.  The pt gave a description of the clothing which includes: (1) Blue T-shirt with Cats and a rainbow image (1) Black T-shirt with Space Cat theme (1) Grey Leggings pant (1) Black Leggings pant (1)  Black Sports Bra (1) Black underwear   Staff contacted EVS who said that she was not informed that It was a special kind of clean and cleaned the room like a D/C. However the pt had other items on the window seal that were not removed.  EVS is on the unit looking through the main trashcan now. EVS does not seem to know where the bag is.   **** PT CLOTHING WAS FOUND IN THE TRASH CAN BY EVS*****

## 2023-11-15 NOTE — Progress Notes (Signed)
 Patient presents: Flat affect and reserved with minimal engagement. Patient remained mostly isolative to room.    SI/HI/AVH: Endorses passive SI. Denies HI and A/V/H with no plan or intent. Reports anxiety as an 8 out of 10 and depression 10 out of 10.    Plan: Denies   Groups attended: 1/2   Appetite: poor appetite. Did not eat breakfast or lunch. Im not hungry. Patient did eat dinner.   Sleep: No sleep disturbances reported.   PRNS: propanolol due to anxiety which provided some relief.   Disturbances: No disturbances. Patient remains cooperative in milieu.    Questions/concerns: No further questions or concerns.  CIWA Scores: Patient reports decreasing anxiety. Denies major s/s of withdrawal.   12pm: 4   1748: 1  VS: BP 132/86 (BP Location: Left Arm)   Pulse 63   Temp 98.5 F (36.9 C) (Oral)   Resp 18   Ht 5' 5 (1.651 m)   Wt 64 kg   SpO2 98%   BMI 23.46 kg/m

## 2023-11-15 NOTE — Care Plan (Signed)
 Pt was moved from room 10 to room 6. Pt was moved to this room before EVS was able to come clean the room and bed A. So pt was moved to bed B and EVS arrived to unit around 8pm. After EVS cleaned bed A and left the unit the pt said that her brown bag with her clothing was taken.  The pt gave a description of the clothing which includes: (1) Blue T-shirt with Cats and a rainbow image (1) Black T-shirt with Space Cat theme (1) Grey Leggings pant (1) Black Leggings pant (1)  Black Sports Bra (1) Black underwear   Staff contacted EVS who said that she was not informed that It was a special kind of clean and cleaned the room like a D/C. However the pt had other items on the window seal that were not removed.  EVS is on the unit looking through the main trashcan now. EVS does not seem to know where the bag is.

## 2023-11-15 NOTE — Plan of Care (Signed)
   Problem: Health Behavior/Discharge Planning: Goal: Compliance with treatment plan for underlying cause of condition will improve Outcome: Progressing   Problem: Safety: Goal: Periods of time without injury will increase Outcome: Progressing

## 2023-11-16 DIAGNOSIS — R45851 Suicidal ideations: Secondary | ICD-10-CM

## 2023-11-16 LAB — LIPID PANEL
Cholesterol: 126 mg/dL (ref 0–200)
HDL: 44 mg/dL (ref >40)
LDL Cholesterol: 70 mg/dL (ref 0–99)
Total CHOL/HDL Ratio: 2.9 ratio
Triglycerides: 61 mg/dL (ref <150)
VLDL: 12 mg/dL (ref 0–40)

## 2023-11-16 MED ORDER — HYDROXYZINE HCL 25 MG PO TABS
25.0000 mg | ORAL_TABLET | Freq: Four times a day (QID) | ORAL | Status: DC | PRN
  Administered 2023-11-16 – 2023-11-19 (×10): 25 mg via ORAL
  Filled 2023-11-16 (×6): qty 1

## 2023-11-16 MED ORDER — ESCITALOPRAM OXALATE 10 MG PO TABS
10.0000 mg | ORAL_TABLET | Freq: Every day | ORAL | Status: DC
  Administered 2023-11-17 – 2023-11-20 (×6): 10 mg via ORAL
  Filled 2023-11-16 (×4): qty 1

## 2023-11-16 MED ORDER — ARIPIPRAZOLE 2 MG PO TABS
2.0000 mg | ORAL_TABLET | Freq: Every day | ORAL | Status: DC
  Administered 2023-11-16 – 2023-11-20 (×8): 2 mg via ORAL
  Filled 2023-11-16 (×5): qty 1

## 2023-11-16 NOTE — Progress Notes (Signed)
 [facility] MD Progress Note  11/16/2023 1:09 PM Jesus Wilson  MRN:  [id number]  Jesus Wilson is a 33 yo transgender male to male who presents to this setting following ED appearance being brought in by law enforcement related to concern for SI. Patient has past psychiatric history of ADHD and bulimia and past medical history of stuttering.    Per Chart Review Urine drug screen was positive for tetrahydrocannabinol. Blood alcohol level was elevated (>76) at time of admission. CMP was unremarkable except for BUN 23 and calcium 8.4. EKG performed on 11/11/23 noted QTc 422.   Subjective:  Chart reviewed, case discussed in multidisciplinary meeting, patient seen during rounds.  8/11: Patient seen today for follow-up psychiatric evaluation. She reports her depression remains high. Continues to isolate to room. Feels that no one is helping them here is frustrated about the group sessions. Notes the time that they felt best in life was the first 6 months the started to transition in 2021. Continued to note anxiety. Is forward thinking and wants to start therapy as an outpatient, request that follow up be in person. Continues to endorse passive SI. Discussed increasing Lexapro  and starting low dose Abilify . The risks/benefits/side-effects/alternatives to this medication were discussed in detail with the patient and time was given for questions. The patient consents to medication trial.   8/10 Patient seen today for follow-up psychiatric evaluation. She reports her depression remains high. She states she has been isolating to her room and does not feel like eating. Endorses passive suicidal ideation without plan or intent. Reports anxiety with restlessness and palpitations. Denies auditory or visual hallucinations, paranoia, or delusions. Denies homicidal ideation.   11/14/23: Patient seen today for follow-up psychiatric evaluation. She remains in bed and appears disheveled. She reports significant depression rated  7/10 and anxiety rated 4/10. Denies suicidal ideation, homicidal ideation, auditory or visual hallucinations, paranoia, or delusions. Reports no self-harm or aggressive behaviors since last assessment. States she is tolerating the initiation of Lexapro  5 mg without side effects. She notes this is her first trial of Lexapro  for depressive symptoms. Reports sleep good and appetite good.    Past Psychiatric History: see h&P Family History: History reviewed. No pertinent family history. Social History:  Social History   Substance and Sexual Activity  Alcohol Use Yes   Comment: Patient reports 3/4ths of a 5th of liquor a day     Social History   Substance and Sexual Activity  Drug Use Yes   Types: Marijuana    Social History   Socioeconomic History   Marital status: Single    Spouse name: Not on file   Number of children: Not on file   Years of education: Not on file   Highest education level: 12th grade  Occupational History   Not on file  Tobacco Use   Smoking status: Former    Types: Cigarettes   Smokeless tobacco: Not on file   Tobacco comments:    Vaping only; no longer smoking cigarettes  Vaping Use   Vaping status: Former  Substance and Sexual Activity   Alcohol use: Yes    Comment: Patient reports 3/4ths of a 5th of liquor a day   Drug use: Yes    Types: Marijuana   Sexual activity: Not Currently  Other Topics Concern   Not on file  Social History Narrative   Not on file   Social Drivers of Health   Financial Resource Strain: High Risk (10/04/2022)   Overall Financial Resource Strain (CARDIA)  Difficulty of Paying Living Expenses: Hard  Food Insecurity: No Food Insecurity (11/12/2023)   Hunger Vital Sign    Worried About Running Out of Food in the Last Year: Never true    Ran Out of Food in the Last Year: Never true  Transportation Needs: Unmet Transportation Needs (11/12/2023)   PRAPARE - Transportation    Lack of Transportation (Medical): Yes    Lack of  Transportation (Non-Medical): Yes  Physical Activity: Sufficiently Active (10/04/2022)   Exercise Vital Sign    Days of Exercise per Week: 4 days    Minutes of Exercise per Session: 70 min  Stress: Stress Concern Present (10/04/2022)   [employer] of Occupational Health - Occupational Stress Questionnaire    Feeling of Stress : Rather much  Social Connections: Socially Isolated (10/04/2022)   Social Connection and Isolation Panel    Frequency of Communication with Friends and Family: Once a week    Frequency of Social Gatherings with Friends and Family: Once a week    Attends Religious Services: Never    Database administrator or Organizations: No    Attends Engineer, structural: Not on file    Marital Status: Never married   Past Medical History:  Past Medical History:  Diagnosis Date   Seizures (HCC)    Stutter     Past Surgical History:  Procedure Laterality Date   NO PAST SURGERIES      Current Medications: Current Facility-Administered Medications  Medication Dose Route Frequency Provider Last Rate Last Admin   acetaminophen  (TYLENOL ) tablet 650 mg  650 mg Oral Q6H PRN Weber, Kyra A, NP       alum & mag hydroxide-simeth (MAALOX/MYLANTA) 200-200-20 MG/5ML suspension 30 mL  30 mL Oral Q4H PRN Weber, Kyra A, NP   30 mL at 11/13/23 1727   haloperidol  (HALDOL ) tablet 5 mg  5 mg Oral TID PRN Weber, Kyra A, NP       And   diphenhydrAMINE  (BENADRYL ) capsule 50 mg  50 mg Oral TID PRN Weber, Kyra A, NP       haloperidol  lactate (HALDOL ) injection 5 mg  5 mg Intramuscular TID PRN Weber, Kyra A, NP       And   diphenhydrAMINE  (BENADRYL ) injection 50 mg  50 mg Intramuscular TID PRN Weber, Kyra A, NP       And   LORazepam  (ATIVAN ) injection 2 mg  2 mg Intramuscular TID PRN Weber, Kyra A, NP       haloperidol  lactate (HALDOL ) injection 10 mg  10 mg Intramuscular TID PRN Weber, Kyra A, NP       And   diphenhydrAMINE  (BENADRYL ) injection 50 mg  50 mg Intramuscular TID PRN  Weber, Kyra A, NP       And   LORazepam  (ATIVAN ) injection 2 mg  2 mg Intramuscular TID PRN Weber, Kyra A, NP       escitalopram  (LEXAPRO ) tablet 5 mg  5 mg Oral Daily Cleotilde Hoy HERO, NP   5 mg at 11/16/23 9192   hydrOXYzine  (ATARAX ) tablet 25 mg  25 mg Oral Q6H PRN Gali Spinney E, PA-C   25 mg at 11/16/23 1239   magnesium  hydroxide (MILK OF MAGNESIA) suspension 30 mL  30 mL Oral Daily PRN Weber, Kyra A, NP       ondansetron  (ZOFRAN -ODT) disintegrating tablet 4 mg  4 mg Oral Q8H PRN Belue, Nathan S, RPH   4 mg at 11/13/23 2156   progesterone  (PROMETRIUM ) capsule  200 mg  200 mg Oral Daily Weber, Kyra A, NP   200 mg at 11/16/23 0807   propranolol  (INDERAL ) tablet 10 mg  10 mg Oral BID PRN Cleotilde Hoy HERO, NP   10 mg at 11/16/23 9188   spironolactone  (ALDACTONE ) tablet 100 mg  100 mg Oral Daily Zouev, Dmitri, MD   100 mg at 11/16/23 9191   thiamine  (VITAMIN B1) tablet 100 mg  100 mg Oral Daily Weber, Kyra A, NP   100 mg at 11/16/23 9192   Or   thiamine  (VITAMIN B1) injection 100 mg  100 mg Intravenous Daily Allen Drafts A, NP        Lab Results:  Results for orders placed or performed during the hospital encounter of 11/12/23 (from the past 48 hours)  Lipid panel     Status: None   Collection Time: 11/16/23  6:39 AM  Result Value Ref Range   Cholesterol 126 0 - 200 mg/dL   Triglycerides 61 <849 mg/dL   HDL 44 >59 mg/dL   Total CHOL/HDL Ratio 2.9 RATIO   VLDL 12 0 - 40 mg/dL   LDL Cholesterol 70 0 - 99 mg/dL    Comment:        Total Cholesterol/HDL:CHD Risk Coronary Heart Disease Risk Table                     Men   Women  1/2 Average Risk   3.4   3.3  Average Risk       5.0   4.4  2 X Average Risk   9.6   7.1  3 X Average Risk  23.4   11.0        Use the calculated Patient Ratio above and the CHD Risk Table to determine the patient's CHD Risk.        ATP III CLASSIFICATION (LDL):  <100     mg/dL   Optimal  899-870  mg/dL   Near or Above                    Optimal   130-159  mg/dL   Borderline  839-810  mg/dL   High  >809     mg/dL   Very High Performed at [facility], [address] [place], [place] [address]     Blood Alcohol level:  Lab Results  Component Value Date   ETH 276 (H) 11/11/2023    Metabolic Disorder Labs: No results found for: HGBA1C, MPG No results found for: PROLACTIN Lab Results  Component Value Date   CHOL 126 11/16/2023   TRIG 61 11/16/2023   HDL 44 11/16/2023   CHOLHDL 2.9 11/16/2023   VLDL 12 11/16/2023   LDLCALC 70 11/16/2023      Psychiatric Specialty Exam: Appearance: Flat affect, dysphoric mood, avoidant eye contact Speech: Normal rate, tone, and volume Thought Process: Linear, goal-directed Thought Content: No delusions, hallucinations, or paranoia observed/reported Mood: Dysphoric Affect: Flat Insight: Fair Judgment: Guarded Orientation: Fully oriented to person, place, time, and situation Memory: Intact Concentration: Adequate for conversation Recall: Intact Fund of Knowledge: Appropriate for education level Language: Fluent, appropriate Psychomotor: No abnormal movements noted Sleep: Not addressed today Appetite: Decreased Suicidal Thoughts: Passive SI without plan/intent Homicidal Thoughts: Denies  Musculoskeletal: Strength & Muscle Tone: within normal limits Gait & Station: normal    Physical Exam: Physical Exam Vitals and nursing note reviewed.  HENT:     Head: Atraumatic.  Eyes:  Extraocular Movements: Extraocular movements intact.  Pulmonary:     Effort: Pulmonary effort is normal.  Neurological:     Mental Status: She is alert and oriented to person, place, and time.    Review of Systems  Psychiatric/Behavioral:  Positive for depression and suicidal ideas. Negative for hallucinations. The patient is nervous/anxious. The patient does not have insomnia.    Blood pressure 114/66, pulse 67, temperature 97.9 F (36.6 C), resp. rate 19, height 5' 5 (1.651  m), weight 64 kg, SpO2 100%. Body mass index is 23.46 kg/m.  Diagnosis: Principal Problem:   Suicidal ideations   PLAN: Safety and Monitoring:  -- Voluntary admission to inpatient psychiatric unit for safety, stabilization and treatment  -- Daily contact with patient to assess and evaluate symptoms and progress in treatment  -- Patient's case to be discussed in multi-disciplinary team meeting  -- Observation Level : q15 minute checks  -- Vital signs:  q12 hours  -- Precautions: suicide, elopement, and assault -- Encouraged patient to participate in unit milieu and in scheduled group therapies  2. Psychiatric Diagnoses and Treatment:   Patient meets DSM-5 diagnostic criteria for Major Depressive Disorder, recurrent, severe, without psychotic features based on persistent depressive symptoms including anhedonia, hopelessness, poor sleep, and social withdrawal. Also presenting with Alcohol Use Disorder, severe and cocaine use disorder in remission. Collateral information and chart review support diagnosis. There is a significant history of self-harm through cutting behavior. She denies suicidal intent but presents with high symptom burden and functional impairment. Will begin Lexapro  10mg  to address depression and anxiety, plan to monitor in this setting adjusting plan of care as we move forward.    Major Depressive Disorder, recurrent, severe, without psychotic features Attention-Deficit/Hyperactivity Disorder, unspecified (by history) Bulimia Nervosa (by history) Alcohol Use Disorder, severe Cannabis Use Disorder, mild Personal history of self-harm       Psychiatric Medications Lexapro  was started at 5 mg increased to 10 mg today Started Abilify  2 mg for adjunctive treatment.   Propranolol  10 mg PO BID PRN  CIWA protocol with Ativan  prn   -- The risks/benefits/side-effects/alternatives to this medication were discussed in detail with the patient and time was given for questions. The  patient consents to medication trial.                -- Metabolic profile and EKG monitoring obtained while on an atypical antipsychotic (BMI: Lipid Panel: HbgA1c: QTc:)              -- Encouraged patient to participate in unit milieu and in scheduled group therapies                            3. Medical Issues Being Addressed: no needs identified       4. Discharge Planning:   -- Social work and case management to assist with discharge planning and identification of hospital follow-up needs prior to discharge  -- Estimated LOS: 5-7 days  Donnice FORBES Right, PA-C 11/16/2023, 1:09 PM

## 2023-11-16 NOTE — Progress Notes (Signed)
   11/16/23 1200  Psych Admission Type (Psych Patients Only)  Admission Status Involuntary  Psychosocial Assessment  Patient Complaints Anxiety;Crying spells;Depression  Eye Contact Fair  Facial Expression Anxious  Affect Anxious;Depressed  Speech Soft  Interaction Isolative  Motor Activity Other (Comment) (WNL)  Appearance/Hygiene Unremarkable  Behavior Characteristics Cooperative;Anxious  Mood Depressed;Anxious  Thought Process  Coherency WDL  Content WDL  Delusions None reported or observed  Perception WDL  Hallucination None reported or observed  Judgment Impaired  Confusion None  Danger to Self  Current suicidal ideation? Denies  Self-Injurious Behavior No self-injurious ideation or behavior indicators observed or expressed   Agreement Not to Harm Self Yes  Description of Agreement verbal  Danger to Others  Danger to Others None reported or observed   Patient withdrawn to his room and appears sad and irritable for not getting any better. Vistaril  given x 1 for anxiety with good result. Patient visible in the milieu.Appetite and energy level good. Support and encouragement given.

## 2023-11-16 NOTE — Group Note (Signed)
 Date:  11/16/2023 Time:  10:23 AM  Group Topic/Focus:  Goals Group:   The focus of this group is to help patients establish daily goals to achieve during treatment and discuss how the patient can incorporate goal setting into their daily lives to aide in recovery.    Participation Level:  Active  Participation Quality:  Appropriate  Affect:  Appropriate  Cognitive:  Appropriate  Insight: Appropriate  Engagement in Group:  Engaged  Modes of Intervention:  Activity, Discussion, and Education  Additional Comments:     Skippy LITTIE Bennett 11/16/2023, 10:23 AM

## 2023-11-16 NOTE — Group Note (Signed)
 Date:  11/16/2023 Time:  9:17 PM  Group Topic/Focus:  Personal Choices and Values:   The focus of this group is to help patients assess and explore the importance of values in their lives, how their values affect their decisions, how they express their values and what opposes their expression.    Participation Level:  Active  Participation Quality:  Appropriate  Affect:  Appropriate  Cognitive:  Appropriate  Insight: Appropriate  Engagement in Group:  Engaged  Modes of Intervention:  Discussion  Additional Comments:    Elliot Meldrum L 11/16/2023, 9:17 PM

## 2023-11-16 NOTE — Group Note (Signed)
 [facility] LCSW Group Therapy Note    Group Date: 11/16/2023 Start Time: 1300 End Time: 1400  Type of Therapy and Topic:  Group Therapy:  Overcoming Obstacles  Participation Level:  [facility] PARTICIPATION LEVEL: Minimal  Mood:  Description of Group:   In this group patients will be encouraged to explore what they see as obstacles to their own wellness and recovery. They will be guided to discuss their thoughts, feelings, and behaviors related to these obstacles. The group will process together ways to cope with barriers, with attention given to specific choices patients can make. Each patient will be challenged to identify changes they are motivated to make in order to overcome their obstacles. This group will be process-oriented, with patients participating in exploration of their own experiences as well as giving and receiving support and challenge from other group members.  Therapeutic Goals: 1. Patient will identify personal and current obstacles as they relate to admission. 2. Patient will identify barriers that currently interfere with their wellness or overcoming obstacles.  3. Patient will identify feelings, thought process and behaviors related to these barriers. 4. Patient will identify two changes they are willing to make to overcome these obstacles:    Summary of Patient Progress Patient was present in group. Patient did engage in discussion.  Patient was attentive.    Therapeutic Modalities:   Cognitive Behavioral Therapy Solution Focused Therapy Motivational Interviewing Relapse Prevention Therapy   Sherryle JINNY Margo, LCSW

## 2023-11-16 NOTE — Progress Notes (Signed)
   11/15/23 2200  Psych Admission Type (Psych Patients Only)  Admission Status Involuntary  Psychosocial Assessment  Patient Complaints Anxiety;Depression  Eye Contact Fair  Facial Expression Anxious  Affect Anxious;Depressed  Speech Soft  Interaction Minimal  Motor Activity Other (Comment) (wdl)  Appearance/Hygiene Unremarkable  Behavior Characteristics Cooperative;Appropriate to situation  Mood Depressed;Anxious  Thought Process  Coherency WDL  Content WDL  Delusions None reported or observed  Perception WDL  Hallucination None reported or observed  Judgment Limited  Confusion None  Danger to Self  Current suicidal ideation? Denies  Self-Injurious Behavior No self-injurious ideation or behavior indicators observed or expressed   Agreement Not to Harm Self Yes  Description of Agreement verbal  Danger to Others  Danger to Others None reported or observed    Problem: Education: Goal: Knowledge of [facility] Education information/materials will improve Outcome: Progressing Goal: Emotional status will improve Outcome: Progressing Goal: Mental status will improve Outcome: Progressing Goal: Verbalization of understanding the information provided will improve Outcome: Progressing   Problem: Activity: Goal: Interest or engagement in activities will improve Outcome: Progressing Goal: Sleeping patterns will improve Outcome: Progressing   Problem: Coping: Goal: Ability to verbalize frustrations and anger appropriately will improve Outcome: Progressing Goal: Ability to demonstrate self-control will improve Outcome: Progressing

## 2023-11-16 NOTE — Group Note (Signed)
 Recreation Therapy Group Note   Group Topic:Health and Wellness  Group Date: 11/16/2023 Start Time: 1040 End Time: 1140 Facilitators: Celestia Jeoffrey BRAVO, LRT, CTRS Location: Courtyard  Group Description: [employer]. LRT and patients played games of basketball, drew with chalk, and played corn hole while outside in the courtyard while getting fresh air and sunlight. Music was being played in the background. LRT and peers conversed about different games they have played before, what they do in their free time and anything else that is on their minds. LRT encouraged pts to drink water after being outside, sweating and getting their heart rate up.  Goal Area(s) Addressed: Patient will build on frustration tolerance skills. Patients will partake in a competitive play game with peers. Patients will gain knowledge of new leisure interest/hobby.    Affect/Mood: Appropriate   Participation Level: Active   Participation Quality: Independent   Behavior: Appropriate   Speech/Thought Process: Coherent   Insight: Fair   Judgement: Fair    Modes of Intervention: Activity   Patient Response to Interventions:  Receptive   Education Outcome:  In group clarification offered    Clinical Observations/Individualized Feedback: Sharyne was active in their participation of session activities and group discussion. Pt interacted well with LRT and peers duration of session.    Plan: Continue to engage patient in RT group sessions 2-3x/week.   Jeoffrey BRAVO Celestia, LRT, CTRS 11/16/2023 1:17 PM

## 2023-11-16 NOTE — Group Note (Signed)
 Recreation Therapy Group Note   Group Topic:Coping Skills  Group Date: 11/16/2023 Start Time: 1530 End Time: 1550 Facilitators: Celestia Jeoffrey FORBES ARTICE, CTRS Location: Craft Room  Group Description: Mind Map.  Patient was provided a blank template of a diagram with 32 blank boxes in a tiered system, branching from the center (similar to a bubble chart). LRT directed patients to label the middle of the diagram Coping Skills. LRT and patients then came up with 8 different coping skills as examples. Pt were directed to record their coping skills in the 2nd tier boxes closest to the center.  Patients would then share their coping skills with the group as LRT wrote them out. LRT gave a handout of 99 different coping skills at the end of group.   Goal Area(s) Addressed: Patients will be able to define "coping skills". Patient will identify new coping skills.  Patient will increase communication.   Affect/Mood: Appropriate   Participation Level: Active and Engaged   Participation Quality: Independent   Behavior: Appropriate, Calm, and Cooperative   Speech/Thought Process: Coherent   Insight: Good   Judgement: Good   Modes of Intervention: Education, Worksheet, and Writing   Patient Response to Interventions:  Attentive, Engaged, Interested , and Receptive   Education Outcome:  Acknowledges education   Clinical Observations/Individualized Feedback: Sharyne was active in their participation of session activities and group discussion. Pt identified reading, writing and sports as coping skills. Pt interacted well with LRT and peers duration of session.    Plan: Continue to engage patient in RT group sessions 2-3x/week.   Jeoffrey FORBES Celestia, LRT, CTRS 11/16/2023 5:29 PM

## 2023-11-16 NOTE — Plan of Care (Signed)
  Problem: Education: Goal: Mental status will improve Outcome: Progressing Goal: Verbalization of understanding the information provided will improve Outcome: Progressing   Problem: Activity: Goal: Interest or engagement in activities will improve Outcome: Progressing   Problem: Health Behavior/Discharge Planning: Goal: Identification of resources available to assist in meeting health care needs will improve Outcome: Progressing   Problem: Physical Regulation: Goal: Ability to maintain clinical measurements within normal limits will improve Outcome: Progressing   Problem: Safety: Goal: Periods of time without injury will increase Outcome: Progressing

## 2023-11-17 DIAGNOSIS — R45851 Suicidal ideations: Secondary | ICD-10-CM | POA: Diagnosis not present

## 2023-11-17 LAB — HEMOGLOBIN A1C
Hgb A1c MFr Bld: 4.9 % (ref 4.8–5.6)
Mean Plasma Glucose: 94 mg/dL

## 2023-11-17 MED ORDER — WHITE PETROLATUM EX OINT
TOPICAL_OINTMENT | CUTANEOUS | Status: DC | PRN
  Administered 2023-11-17 (×2): 1 via TOPICAL
  Filled 2023-11-17: qty 5

## 2023-11-17 NOTE — Group Note (Signed)
 Recreation Therapy Group Note   Group Topic:Coping Skills  Group Date: 11/17/2023 Start Time: 1530 End Time: 1620 Facilitators: Celestia Jeoffrey FORBES ARTICE, CTRS Location: Craft Room  Group Description: Coping A-Z. LRT and patients engage in a guided discussion on what coping skills are and gave specific examples. LRT passed out a handout labeled Coping A-Z with blank spaces beside each letter. LRT prompted patients to come up with a coping skill for each of the letters. LRT and patients went over the handout and gave ideas for each letter if anyone had any blanks left on their paper. Patients kept this handout with them that listed 26 different coping skills.   Goal Area(s) Addressed: Patients will be able to define "coping skills". Patient will identify new coping skills.  Patient will increase communication.   Affect/Mood: Appropriate   Participation Level: Active and Engaged   Participation Quality: Independent   Behavior: Appropriate, Calm, and Cooperative   Speech/Thought Process: Coherent   Insight: Good   Judgement: Good   Modes of Intervention: Education, Exploration, Worksheet, and Writing   Patient Response to Interventions:  Attentive, Engaged, Interested , and Receptive   Education Outcome:  Acknowledges education   Clinical Observations/Individualized Feedback: Jesus Wilson was active in their participation of session activities and group discussion. Pt identified  reading and preparing for the future as coping skills.    Plan: Continue to engage patient in RT group sessions 2-3x/week.   Jeoffrey FORBES Celestia, LRT, CTRS 11/17/2023 4:53 PM

## 2023-11-17 NOTE — Group Note (Signed)
 Date:  11/17/2023 Time:  10:05 AM  Group Topic/Focus:  Goals Group:   The focus of this group is to help patients establish daily goals to achieve during treatment and discuss how the patient can incorporate goal setting into their daily lives to aide in recovery.    Participation Level:  Active  Participation Quality:  Appropriate  Affect:  Appropriate  Cognitive:  Appropriate  Insight: Appropriate  Engagement in Group:  Engaged  Modes of Intervention:  Discussion, Education, and Support  Additional Comments:    Deitra Caron Mainland 11/17/2023, 10:05 AM

## 2023-11-17 NOTE — Plan of Care (Signed)
  Problem: Education: Goal: Emotional status will improve Outcome: Not Progressing Goal: Mental status will improve Outcome: Not Progressing Goal: Verbalization of understanding the information provided will improve Outcome: Progressing   Problem: Coping: Goal: Ability to verbalize frustrations and anger appropriately will improve Outcome: Not Progressing

## 2023-11-17 NOTE — Progress Notes (Signed)
   11/17/23 1200  Psych Admission Type (Psych Patients Only)  Admission Status Involuntary  Psychosocial Assessment  Patient Complaints Anxiety  Eye Contact Fair  Facial Expression Other (Comment) (WNL)  Affect Appropriate to circumstance  Speech Soft  Interaction Assertive  Motor Activity Other (Comment) (WNL)  Appearance/Hygiene Unremarkable  Behavior Characteristics Cooperative;Appropriate to situation  Mood Anxious;Pleasant  Thought Process  Coherency WDL  Content WDL  Delusions None reported or observed  Perception WDL  Hallucination None reported or observed  Judgment Impaired  Confusion None  Danger to Self  Current suicidal ideation? Denies  Self-Injurious Behavior No self-injurious ideation or behavior indicators observed or expressed   Agreement Not to Harm Self No  Description of Agreement verbal  Danger to Others  Danger to Others None reported or observed   PRN for anxiety x 1 with good result.Visible in the milieu. ADLs maintained.

## 2023-11-17 NOTE — Plan of Care (Signed)
  Problem: Education: Goal: Knowledge of  General Education information/materials will improve Outcome: Progressing Goal: Emotional status will improve Outcome: Progressing Goal: Verbalization of understanding the information provided will improve Outcome: Progressing   Problem: Activity: Goal: Interest or engagement in activities will improve Outcome: Progressing   Problem: Coping: Goal: Ability to demonstrate self-control will improve Outcome: Progressing   Problem: Physical Regulation: Goal: Ability to maintain clinical measurements within normal limits will improve Outcome: Progressing   Problem: Safety: Goal: Periods of time without injury will increase Outcome: Progressing

## 2023-11-17 NOTE — Group Note (Signed)
 [facility] LCSW Group Therapy Note   Group Date: 11/17/2023 Start Time: 1300 End Time: 1345  Type of Therapy/Topic:  Group Therapy:  Feelings about Diagnosis  Participation Level:  Active    Description of Group:    This group will allow patients to explore their thoughts and feelings about diagnoses they have received. Patients will be guided to explore their level of understanding and acceptance of these diagnoses. Facilitator will encourage patients to process their thoughts and feelings about the reactions of others to their diagnosis, and will guide patients in identifying ways to discuss their diagnosis with significant others in their lives. This group will be process-oriented, with patients participating in exploration of their own experiences as well as giving and receiving support and challenge from other group members.   Therapeutic Goals: 1. Patient will demonstrate understanding of diagnosis as evidence by identifying two or more symptoms of the disorder:  2. Patient will be able to express two feelings regarding the diagnosis 3. Patient will demonstrate ability to communicate their needs through discussion and/or role plays  Summary of Patient Progress: Patient was present for the entirety of the group process. She shared that knowing/having a mental health diagnosis would help give her some answers and some direction. She was actively engaged in the discussion. She appeared open and receptive to feedback/comments from both her peers and the facilitator.    Therapeutic Modalities:   Cognitive Behavioral Therapy Brief Therapy Feelings Identification    Nadara JONELLE Fam, LCSW

## 2023-11-17 NOTE — B[facility] Counselor (Signed)
 CSW attempted contact with [facility] care coordinator, Nat [phone]). Contact was unable to be established but HIPAA compliant voicemail left with contact information for follow through.   Nadara SAUNDERS. Chaim, MSW, LCSW, LCAS 11/17/2023 3:02 PM

## 2023-11-17 NOTE — Group Note (Signed)
 Recreation Therapy Group Note   Group Topic:General Recreation  Group Date: 11/17/2023 Start Time: 1000 End Time: 1110 Facilitators: Celestia Jeoffrey BRAVO, LRT, CTRS Location: Courtyard  Group Description: [employer]. LRT and patients played games of basketball, drew with chalk, and played corn hole while outside in the courtyard while getting fresh air and sunlight. Music was being played in the background. LRT and peers conversed about different games they have played before, what they do in their free time and anything else that is on their minds. LRT encouraged pts to drink water after being outside, sweating and getting their heart rate up.  Goal Area(s) Addressed: Patient will build on frustration tolerance skills. Patients will partake in a competitive play game with peers. Patients will gain knowledge of new leisure interest/hobby.    Affect/Mood: Appropriate   Participation Level: Active   Participation Quality: Independent   Behavior: Appropriate   Speech/Thought Process: Coherent   Insight: Good   Judgement: Good   Modes of Intervention: Activity   Patient Response to Interventions:  Receptive   Education Outcome:  Acknowledges education   Clinical Observations/Individualized Feedback: Jesus Wilson was active in their participation of session activities and group discussion. Pt interacted well with LRT and peers duration of session.    Plan: Continue to engage patient in RT group sessions 2-3x/week.   Jeoffrey BRAVO Celestia, LRT, CTRS 11/17/2023 11:49 AM

## 2023-11-17 NOTE — Progress Notes (Signed)
 [facility] MD Progress Note  11/17/2023 11:01 AM Jesus Wilson  MRN:  [id number]  [name] is a 33 yo transgender male to male who presents to this setting following ED appearance being brought in by law enforcement related to concern for SI. Patient has past psychiatric history of ADHD and bulimia and past medical history of stuttering.    Per Chart Review Urine drug screen was positive for tetrahydrocannabinol. Blood alcohol level was elevated (>76) at time of admission. CMP was unremarkable except for BUN 23 and calcium 8.4. EKG performed on 11/11/23 noted QTc 422.   Subjective:  Chart reviewed, case discussed in multidisciplinary meeting, patient seen during rounds.   8/12: Patient is doing well today. Affect is brighter, they were outside and have been going to group.  They deny SI, HI, and AVH.  They are linear logical and future oriented.  Demonstrate good insight into the need for outpatient follow-up and continued medication compliance.  Indicate they felt heard by this provider.  They deny adverse effects of the medications.  They are agreeable with continuing current medication regimen.  They are transitioning and reports they are doing their estrogen shot tomorrow.  Discussed that if the family ever can bring to the hospital we can get it cleared by pharmacy they were agreeable with this and plan to call their mother who plan to come during visitation tonight.  Nursing staff has been briefed.  They voiced no concerns or complaint at this time.  They utilize as needed hydroxyzine  yesterday and today and found helpful.  No med adjustments.  No withdrawal symptoms.    8/11: Patient seen today for follow-up psychiatric evaluation. She reports her depression remains high. Continues to isolate to room. Feels that no one is helping them here is frustrated about the group sessions. Notes the time that they felt best in life was the first 6 months the started to transition in 2021. Continued to note  anxiety. Is forward thinking and wants to start therapy as an outpatient, request that follow up be in person. Continues to endorse passive SI. Discussed increasing Lexapro  and starting low dose Abilify . The risks/benefits/side-effects/alternatives to this medication were discussed in detail with the patient and time was given for questions. The patient consents to medication trial.   8/10 Patient seen today for follow-up psychiatric evaluation. She reports her depression remains high. She states she has been isolating to her room and does not feel like eating. Endorses passive suicidal ideation without plan or intent. Reports anxiety with restlessness and palpitations. Denies auditory or visual hallucinations, paranoia, or delusions. Denies homicidal ideation.   11/14/23: Patient seen today for follow-up psychiatric evaluation. She remains in bed and appears disheveled. She reports significant depression rated 7/10 and anxiety rated 4/10. Denies suicidal ideation, homicidal ideation, auditory or visual hallucinations, paranoia, or delusions. Reports no self-harm or aggressive behaviors since last assessment. States she is tolerating the initiation of Lexapro  5 mg without side effects. She notes this is her first trial of Lexapro  for depressive symptoms. Reports sleep good and appetite good.    Past Psychiatric History: see h&P Family History: History reviewed. No pertinent family history. Social History:  Social History   Substance and Sexual Activity  Alcohol Use Yes   Comment: Patient reports 3/4ths of a 5th of liquor a day     Social History   Substance and Sexual Activity  Drug Use Yes   Types: Marijuana    Social History   Socioeconomic History  Marital status: Single    Spouse name: Not on file   Number of children: Not on file   Years of education: Not on file   Highest education level: 12th grade  Occupational History   Not on file  Tobacco Use   Smoking status: Former     Types: Cigarettes   Smokeless tobacco: Not on file   Tobacco comments:    Vaping only; no longer smoking cigarettes  Vaping Use   Vaping status: Former  Substance and Sexual Activity   Alcohol use: Yes    Comment: Patient reports 3/4ths of a 5th of liquor a day   Drug use: Yes    Types: Marijuana   Sexual activity: Not Currently  Other Topics Concern   Not on file  Social History Narrative   Not on file   Social Drivers of Health   Financial Resource Strain: High Risk (10/04/2022)   Overall Financial Resource Strain (CARDIA)    Difficulty of Paying Living Expenses: Hard  Food Insecurity: No Food Insecurity (11/12/2023)   Hunger Vital Sign    Worried About Running Out of Food in the Last Year: Never true    Ran Out of Food in the Last Year: Never true  Transportation Needs: Unmet Transportation Needs (11/12/2023)   PRAPARE - Transportation    Lack of Transportation (Medical): Yes    Lack of Transportation (Non-Medical): Yes  Physical Activity: Sufficiently Active (10/04/2022)   Exercise Vital Sign    Days of Exercise per Week: 4 days    Minutes of Exercise per Session: 70 min  Stress: Stress Concern Present (10/04/2022)   [employer] of Occupational Health - Occupational Stress Questionnaire    Feeling of Stress : Rather much  Social Connections: Socially Isolated (10/04/2022)   Social Connection and Isolation Panel    Frequency of Communication with Friends and Family: Once a week    Frequency of Social Gatherings with Friends and Family: Once a week    Attends Religious Services: Never    Database administrator or Organizations: No    Attends Engineer, structural: Not on file    Marital Status: Never married   Past Medical History:  Past Medical History:  Diagnosis Date   Seizures (HCC)    Stutter     Past Surgical History:  Procedure Laterality Date   NO PAST SURGERIES      Current Medications: Current Facility-Administered Medications  Medication  Dose Route Frequency Provider Last Rate Last Admin   acetaminophen  (TYLENOL ) tablet 650 mg  650 mg Oral Q6H PRN Weber, Kyra A, NP       alum & mag hydroxide-simeth (MAALOX/MYLANTA) 200-200-20 MG/5ML suspension 30 mL  30 mL Oral Q4H PRN Weber, Kyra A, NP   30 mL at 11/13/23 1727   ARIPiprazole  (ABILIFY ) tablet 2 mg  2 mg Oral Daily Orey Moure E, PA-C   2 mg at 11/17/23 0800   haloperidol  (HALDOL ) tablet 5 mg  5 mg Oral TID PRN Weber, Kyra A, NP       And   diphenhydrAMINE  (BENADRYL ) capsule 50 mg  50 mg Oral TID PRN Weber, Kyra A, NP       haloperidol  lactate (HALDOL ) injection 5 mg  5 mg Intramuscular TID PRN Weber, Kyra A, NP       And   diphenhydrAMINE  (BENADRYL ) injection 50 mg  50 mg Intramuscular TID PRN Weber, Kyra A, NP       And   LORazepam  (ATIVAN )  injection 2 mg  2 mg Intramuscular TID PRN Weber, Kyra A, NP       haloperidol  lactate (HALDOL ) injection 10 mg  10 mg Intramuscular TID PRN Weber, Kyra A, NP       And   diphenhydrAMINE  (BENADRYL ) injection 50 mg  50 mg Intramuscular TID PRN Weber, Kyra A, NP       And   LORazepam  (ATIVAN ) injection 2 mg  2 mg Intramuscular TID PRN Weber, Kyra A, NP       escitalopram  (LEXAPRO ) tablet 10 mg  10 mg Oral Daily Kylea Berrong E, PA-C   10 mg at 11/17/23 0800   hydrOXYzine  (ATARAX ) tablet 25 mg  25 mg Oral Q6H PRN Jahron Hunsinger E, PA-C   25 mg at 11/17/23 0800   magnesium  hydroxide (MILK OF MAGNESIA) suspension 30 mL  30 mL Oral Daily PRN Weber, Kyra A, NP       ondansetron  (ZOFRAN -ODT) disintegrating tablet 4 mg  4 mg Oral Q8H PRN Belue, Nathan S, RPH   4 mg at 11/13/23 2156   progesterone  (PROMETRIUM ) capsule 200 mg  200 mg Oral Daily Weber, Kyra A, NP   200 mg at 11/17/23 0800   propranolol  (INDERAL ) tablet 10 mg  10 mg Oral BID PRN Cleotilde Hoy HERO, NP   10 mg at 11/16/23 9188   spironolactone  (ALDACTONE ) tablet 100 mg  100 mg Oral Daily Zouev, Dmitri, MD   100 mg at 11/17/23 0800   thiamine  (VITAMIN B1) tablet 100  mg  100 mg Oral Daily Weber, Kyra A, NP   100 mg at 11/17/23 0800   Or   thiamine  (VITAMIN B1) injection 100 mg  100 mg Intravenous Daily Weber, Kyra A, NP        Lab Results:  Results for orders placed or performed during the hospital encounter of 11/12/23 (from the past 48 hours)  Hemoglobin A1c     Status: None   Collection Time: 11/16/23  6:39 AM  Result Value Ref Range   Hgb A1c MFr Bld 4.9 4.8 - 5.6 %    Comment: (NOTE)         Prediabetes: 5.7 - 6.4         Diabetes: >6.4         Glycemic control for adults with diabetes: <7.0    Mean Plasma Glucose 94 mg/dL    Comment: (NOTE) Performed At: [facility] [address] [place], [place] [id number] Jennette Shorter MD Ey:[id number]   Lipid panel     Status: None   Collection Time: 11/16/23  6:39 AM  Result Value Ref Range   Cholesterol 126 0 - 200 mg/dL   Triglycerides 61 <849 mg/dL   HDL 44 >59 mg/dL   Total CHOL/HDL Ratio 2.9 RATIO   VLDL 12 0 - 40 mg/dL   LDL Cholesterol 70 0 - 99 mg/dL    Comment:        Total Cholesterol/HDL:CHD Risk Coronary Heart Disease Risk Table                     Men   Women  1/2 Average Risk   3.4   3.3  Average Risk       5.0   4.4  2 X Average Risk   9.6   7.1  3 X Average Risk  23.4   11.0        Use the calculated Patient Ratio above and the CHD Risk Table to  determine the patient's CHD Risk.        ATP III CLASSIFICATION (LDL):  <100     mg/dL   Optimal  899-870  mg/dL   Near or Above                    Optimal  130-159  mg/dL   Borderline  839-810  mg/dL   High  >809     mg/dL   Very High Performed at [facility], [address] [place], [place] [address]     Blood Alcohol level:  Lab Results  Component Value Date   ETH 276 (H) 11/11/2023    Metabolic Disorder Labs: Lab Results  Component Value Date   HGBA1C 4.9 11/16/2023   MPG 94 11/16/2023   No results found for: PROLACTIN Lab Results  Component Value Date   CHOL 126 11/16/2023   TRIG  61 11/16/2023   HDL 44 11/16/2023   CHOLHDL 2.9 11/16/2023   VLDL 12 11/16/2023   LDLCALC 70 11/16/2023      Psychiatric Specialty Exam: Appearance: Flat affect, dysphoric mood, avoidant eye contact Speech: Normal rate, tone, and volume Thought Process: Linear, goal-directed Thought Content: No delusions, hallucinations, or paranoia observed/reported Mood: Dysphoric Affect: Flat Insight: Fair Judgment: Guarded Orientation: Fully oriented to person, place, time, and situation Memory: Intact Concentration: Adequate for conversation Recall: Intact Fund of Knowledge: Appropriate for education level Language: Fluent, appropriate Psychomotor: No abnormal movements noted Sleep: Not addressed today Appetite: Decreased Suicidal Thoughts: Passive SI without plan/intent Homicidal Thoughts: Denies  Musculoskeletal: Strength & Muscle Tone: within normal limits Gait & Station: normal    Physical Exam: Physical Exam Vitals and nursing note reviewed.  HENT:     Head: Atraumatic.  Eyes:     Extraocular Movements: Extraocular movements intact.  Pulmonary:     Effort: Pulmonary effort is normal.  Neurological:     Mental Status: She is alert and oriented to person, place, and time.    Review of Systems  Psychiatric/Behavioral:  Positive for depression and suicidal ideas. Negative for hallucinations. The patient is nervous/anxious. The patient does not have insomnia.    Blood pressure 109/78, pulse (!) 58, temperature (!) 97.2 F (36.2 C), resp. rate 16, height 5' 5 (1.651 m), weight 64 kg, SpO2 100%. Body mass index is 23.46 kg/m.  Diagnosis: Principal Problem:   Suicidal ideations   PLAN: Safety and Monitoring:  -- Voluntary admission to inpatient psychiatric unit for safety, stabilization and treatment  -- Daily contact with patient to assess and evaluate symptoms and progress in treatment  -- Patient's case to be discussed in multi-disciplinary team meeting  --  Observation Level : q15 minute checks  -- Vital signs:  q12 hours  -- Precautions: suicide, elopement, and assault -- Encouraged patient to participate in unit milieu and in scheduled group therapies  2. Psychiatric Diagnoses and Treatment:   Patient meets DSM-5 diagnostic criteria for Major Depressive Disorder, recurrent, severe, without psychotic features based on persistent depressive symptoms including anhedonia, hopelessness, poor sleep, and social withdrawal. Also presenting with Alcohol Use Disorder, severe and cocaine use disorder in remission. Collateral information and chart review support diagnosis. There is a significant history of self-harm through cutting behavior. She denies suicidal intent but presents with high symptom burden and functional impairment.  Patient continues with severe depression.  We increased Lexapro  from 5 mg to 10 mg and started on Abilify  for adjunctive treatment.  Patient is tolerating well.  Patient is  due estrogen shot tomorrow and family members bring so we can get her cleared by pharmacy.  Patient continues to require inpatient psychiatric hospitalization for medication management and stabilization due to risk of self injury.   Major Depressive Disorder, recurrent, severe, without psychotic features Attention-Deficit/Hyperactivity Disorder, unspecified (by history) Bulimia Nervosa (by history) Alcohol Use Disorder, severe Cannabis Use Disorder, mild Personal history of self-harm       Psychiatric Medications Lexapro  was started at 5 mg increased to 10 mg today Started Abilify  2 mg for adjunctive treatment.  Discontinue propranolol  10 mg PO BID PRN  CIWA protocol with Ativan  prn   -- The risks/benefits/side-effects/alternatives to this medication were discussed in detail with the patient and time was given for questions. The patient consents to medication trial.                -- Metabolic profile and EKG monitoring obtained while on an atypical  antipsychotic (BMI: Lipid Panel: HbgA1c: QTc:)              -- Encouraged patient to participate in unit milieu and in scheduled group therapies                            3. Medical Issues Being Addressed: no needs identified       4. Discharge Planning:   -- Social work and case management to assist with discharge planning and identification of hospital follow-up needs prior to discharge  -- Estimated LOS: 5-7 days  Donnice FORBES Right, PA-C 11/17/2023, 11:01 AM

## 2023-11-17 NOTE — Progress Notes (Signed)
 Pt calm and pleasant during assessment denying SI/HI/AVH. Pt stated she is feeling much better than when she first arrived on the unit. Pt didn't have any medications scheduled tonight and hasn't requested anything PRN as of now. Pt given education, support, and encouragement to be active in her treatment plan. Pt being monitored Q 15 minutes for safety per unit protocol remains safe on the unit

## 2023-11-17 NOTE — Group Note (Signed)
 Date:  11/17/2023 Time:  11:30 PM  Group Topic/Focus:  Wrap-Up Group:   The focus of this group is to help patients review their daily goal of treatment and discuss progress on daily workbooks.    Participation Level:  Minimal  Participation Quality:  Appropriate  Affect:  Appropriate  Cognitive:  Appropriate  Insight: Appropriate  Engagement in Group:  Limited  Modes of Intervention:  Discussion and Orientation  Additional Comments:     Jesus Wilson 11/17/2023, 11:30 PM

## 2023-11-18 DIAGNOSIS — R45851 Suicidal ideations: Secondary | ICD-10-CM | POA: Diagnosis not present

## 2023-11-18 MED ORDER — ESTRADIOL VALERATE 20 MG/ML IM OIL
6.0000 mg | TOPICAL_OIL | INTRAMUSCULAR | Status: DC
  Administered 2023-11-18 (×2): 6 mg via INTRAMUSCULAR
  Filled 2023-11-18: qty 0.3

## 2023-11-18 NOTE — Group Note (Signed)
 Date:  11/18/2023 Time:  4:12 PM  Group Topic/Focus:  Activity Group: The focus of the group is to promote activity for the patients and encourage them to go outside to the courtyard and get some fresh air and some exercise.    Participation Level:  Active  Participation Quality:  Appropriate  Affect:  Appropriate  Cognitive:  Appropriate  Insight: Appropriate  Engagement in Group:  Engaged  Modes of Intervention:  Activity  Additional Comments:    Camellia HERO Sabino Denning 11/18/2023, 4:12 PM

## 2023-11-18 NOTE — Plan of Care (Signed)
   Problem: Education: Goal: Emotional status will improve Outcome: Progressing Goal: Mental status will improve Outcome: Progressing

## 2023-11-18 NOTE — Progress Notes (Signed)
 [facility] MD Progress Note  11/18/2023 9:14 AM Jesus Wilson  MRN:  [id number]  Jesus Wilson is a 33 yo transgender male to male who presents to this setting following ED appearance being brought in by law enforcement related to concern for SI. Patient has past psychiatric history of ADHD and bulimia and past medical history of stuttering.    Per Chart Review Urine drug screen was positive for tetrahydrocannabinol. Blood alcohol level was elevated (>76) at time of admission. CMP was unremarkable except for BUN 23 and calcium 8.4. EKG performed on 11/11/23 noted QTc 422.   Subjective:  Chart reviewed, case discussed in multidisciplinary meeting, patient seen during rounds.   8/13: Patient seen for follow up. They are alert and oriented. They are pleasant and cooperative on exam. They are linear, logical, and future oriented. They are medication compliant and are attending group sessions. Felt they were able to learn coping skills yesterday at group and are working on letting go of the past and being more future oriented. States they feel like they are getting better. Wants to engage with therapy when they discharge. Deny SI/HI/AVH. Some drowsiness with the abilify  but report that it is improving.   8/12: Patient is doing well today. Affect is brighter, they were outside and have been going to group.  They deny SI, HI, and AVH.  They are linear logical and future oriented.  Demonstrate good insight into the need for outpatient follow-up and continued medication compliance.  Indicate they felt heard by this provider.  They deny adverse effects of the medications.  They are agreeable with continuing current medication regimen.  They are transitioning and reports they are doing their estrogen shot tomorrow.  Discussed that if the family ever can bring to the hospital we can get it cleared by pharmacy they were agreeable with this and plan to call their mother who plan to come during visitation tonight.  Nursing staff  has been briefed.  They voiced no concerns or complaint at this time.  They utilize as needed hydroxyzine  yesterday and today and found helpful.  No med adjustments.  No withdrawal symptoms.    8/11: Patient seen today for follow-up psychiatric evaluation. She reports her depression remains high. Continues to isolate to room. Feels that no one is helping them here is frustrated about the group sessions. Notes the time that they felt best in life was the first 6 months the started to transition in 2021. Continued to note anxiety. Is forward thinking and wants to start therapy as an outpatient, request that follow up be in person. Continues to endorse passive SI. Discussed increasing Lexapro  and starting low dose Abilify . The risks/benefits/side-effects/alternatives to this medication were discussed in detail with the patient and time was given for questions. The patient consents to medication trial.   8/10 Patient seen today for follow-up psychiatric evaluation. She reports her depression remains high. She states she has been isolating to her room and does not feel like eating. Endorses passive suicidal ideation without plan or intent. Reports anxiety with restlessness and palpitations. Denies auditory or visual hallucinations, paranoia, or delusions. Denies homicidal ideation.   11/14/23: Patient seen today for follow-up psychiatric evaluation. She remains in bed and appears disheveled. She reports significant depression rated 7/10 and anxiety rated 4/10. Denies suicidal ideation, homicidal ideation, auditory or visual hallucinations, paranoia, or delusions. Reports no self-harm or aggressive behaviors since last assessment. States she is tolerating the initiation of Lexapro  5 mg without side effects. She notes this  is her first trial of Lexapro  for depressive symptoms. Reports sleep good and appetite good.    Past Psychiatric History: see h&P Family History: History reviewed. No pertinent family  history. Social History:  Social History   Substance and Sexual Activity  Alcohol Use Yes   Comment: Patient reports 3/4ths of a 5th of liquor a day     Social History   Substance and Sexual Activity  Drug Use Yes   Types: Marijuana    Social History   Socioeconomic History   Marital status: Single    Spouse name: Not on file   Number of children: Not on file   Years of education: Not on file   Highest education level: 12th grade  Occupational History   Not on file  Tobacco Use   Smoking status: Former    Types: Cigarettes   Smokeless tobacco: Not on file   Tobacco comments:    Vaping only; no longer smoking cigarettes  Vaping Use   Vaping status: Former  Substance and Sexual Activity   Alcohol use: Yes    Comment: Patient reports 3/4ths of a 5th of liquor a day   Drug use: Yes    Types: Marijuana   Sexual activity: Not Currently  Other Topics Concern   Not on file  Social History Narrative   Not on file   Social Drivers of Health   Financial Resource Strain: High Risk (10/04/2022)   Overall Financial Resource Strain (CARDIA)    Difficulty of Paying Living Expenses: Hard  Food Insecurity: No Food Insecurity (11/12/2023)   Hunger Vital Sign    Worried About Running Out of Food in the Last Year: Never true    Ran Out of Food in the Last Year: Never true  Transportation Needs: Unmet Transportation Needs (11/12/2023)   PRAPARE - Transportation    Lack of Transportation (Medical): Yes    Lack of Transportation (Non-Medical): Yes  Physical Activity: Sufficiently Active (10/04/2022)   Exercise Vital Sign    Days of Exercise per Week: 4 days    Minutes of Exercise per Session: 70 min  Stress: Stress Concern Present (10/04/2022)   [employer] of Occupational Health - Occupational Stress Questionnaire    Feeling of Stress : Rather much  Social Connections: Socially Isolated (10/04/2022)   Social Connection and Isolation Panel    Frequency of Communication with  Friends and Family: Once a week    Frequency of Social Gatherings with Friends and Family: Once a week    Attends Religious Services: Never    Database administrator or Organizations: No    Attends Engineer, structural: Not on file    Marital Status: Never married   Past Medical History:  Past Medical History:  Diagnosis Date   Seizures (HCC)    Stutter     Past Surgical History:  Procedure Laterality Date   NO PAST SURGERIES      Current Medications: Current Facility-Administered Medications  Medication Dose Route Frequency Provider Last Rate Last Admin   acetaminophen  (TYLENOL ) tablet 650 mg  650 mg Oral Q6H PRN Weber, Kyra A, NP       alum & mag hydroxide-simeth (MAALOX/MYLANTA) 200-200-20 MG/5ML suspension 30 mL  30 mL Oral Q4H PRN Weber, Kyra A, NP   30 mL at 11/13/23 1727   ARIPiprazole  (ABILIFY ) tablet 2 mg  2 mg Oral Daily Kiauna Zywicki E, PA-C   2 mg at 11/18/23 9187   haloperidol  (HALDOL ) tablet 5 mg  5 mg Oral TID PRN Weber, Kyra A, NP       And   diphenhydrAMINE  (BENADRYL ) capsule 50 mg  50 mg Oral TID PRN Weber, Kyra A, NP       haloperidol  lactate (HALDOL ) injection 5 mg  5 mg Intramuscular TID PRN Weber, Kyra A, NP       And   diphenhydrAMINE  (BENADRYL ) injection 50 mg  50 mg Intramuscular TID PRN Weber, Kyra A, NP       And   LORazepam  (ATIVAN ) injection 2 mg  2 mg Intramuscular TID PRN Weber, Kyra A, NP       haloperidol  lactate (HALDOL ) injection 10 mg  10 mg Intramuscular TID PRN Weber, Kyra A, NP       And   diphenhydrAMINE  (BENADRYL ) injection 50 mg  50 mg Intramuscular TID PRN Weber, Kyra A, NP       And   LORazepam  (ATIVAN ) injection 2 mg  2 mg Intramuscular TID PRN Weber, Kyra A, NP       escitalopram  (LEXAPRO ) tablet 10 mg  10 mg Oral Daily Hinley Brimage E, PA-C   10 mg at 11/18/23 9187   hydrOXYzine  (ATARAX ) tablet 25 mg  25 mg Oral Q6H PRN Janeane Cozart E, PA-C   25 mg at 11/17/23 0800   magnesium  hydroxide (MILK OF  MAGNESIA) suspension 30 mL  30 mL Oral Daily PRN Weber, Kyra A, NP       ondansetron  (ZOFRAN -ODT) disintegrating tablet 4 mg  4 mg Oral Q8H PRN Belue, Nathan S, RPH   4 mg at 11/13/23 2156   progesterone  (PROMETRIUM ) capsule 200 mg  200 mg Oral Daily Weber, Kyra A, NP   200 mg at 11/18/23 9187   spironolactone  (ALDACTONE ) tablet 100 mg  100 mg Oral Daily Zouev, Dmitri, MD   100 mg at 11/18/23 9187   thiamine  (VITAMIN B1) tablet 100 mg  100 mg Oral Daily Weber, Kyra A, NP   100 mg at 11/18/23 9187   Or   thiamine  (VITAMIN B1) injection 100 mg  100 mg Intravenous Daily Weber, Kyra A, NP       white petrolatum  (VASELINE) gel   Topical PRN Mahkai Fangman E, PA-C   1 Application at 11/17/23 1347    Lab Results:  No results found for this or any previous visit (from the past 48 hours).   Blood Alcohol level:  Lab Results  Component Value Date   ETH 276 (H) 11/11/2023    Metabolic Disorder Labs: Lab Results  Component Value Date   HGBA1C 4.9 11/16/2023   MPG 94 11/16/2023   No results found for: PROLACTIN Lab Results  Component Value Date   CHOL 126 11/16/2023   TRIG 61 11/16/2023   HDL 44 11/16/2023   CHOLHDL 2.9 11/16/2023   VLDL 12 11/16/2023   LDLCALC 70 11/16/2023      Psychiatric Specialty Exam: Appearance: Flat affect, dysphoric mood, avoidant eye contact Speech: Normal rate, tone, and volume Thought Process: Linear, goal-directed Thought Content: No delusions, hallucinations, or paranoia observed/reported Mood: Dysphoric Affect: Flat Insight: Fair Judgment: Guarded Orientation: Fully oriented to person, place, time, and situation Memory: Intact Concentration: Adequate for conversation Recall: Intact Fund of Knowledge: Appropriate for education level Language: Fluent, appropriate Psychomotor: No abnormal movements noted Sleep: Not addressed today Appetite: Decreased Suicidal Thoughts: Passive SI without plan/intent Homicidal Thoughts:  Denies  Musculoskeletal: Strength & Muscle Tone: within normal limits Gait & Station: normal    Physical Exam: Physical  Exam Vitals and nursing note reviewed.  HENT:     Head: Atraumatic.  Eyes:     Extraocular Movements: Extraocular movements intact.  Pulmonary:     Effort: Pulmonary effort is normal.  Neurological:     Mental Status: She is alert and oriented to person, place, and time.    Review of Systems  Psychiatric/Behavioral:  Positive for depression. Negative for hallucinations and suicidal ideas. The patient is nervous/anxious. The patient does not have insomnia.    Blood pressure 117/78, pulse (!) 58, temperature (!) 97.1 F (36.2 C), resp. rate 18, height 5' 5 (1.651 m), weight 64 kg, SpO2 100%. Body mass index is 23.46 kg/m.  Diagnosis: Principal Problem:   Suicidal ideations   PLAN: Safety and Monitoring:  -- Voluntary admission to inpatient psychiatric unit for safety, stabilization and treatment  -- Daily contact with patient to assess and evaluate symptoms and progress in treatment  -- Patient's case to be discussed in multi-disciplinary team meeting  -- Observation Level : q15 minute checks  -- Vital signs:  q12 hours  -- Precautions: suicide, elopement, and assault -- Encouraged patient to participate in unit milieu and in scheduled group therapies  2. Psychiatric Diagnoses and Treatment:   Patient meets DSM-5 diagnostic criteria for Major Depressive Disorder, recurrent, severe, without psychotic features based on persistent depressive symptoms including anhedonia, hopelessness, poor sleep, and social withdrawal. There is a significant history of self-harm through cutting behavior. She denies suicidal intent but presents with high symptom burden and functional impairment.  Patient continues with severe depression.  Patient is toleratig ireased lexapro  dose well, some drowsiness with abilify  but indicates is improving. Working with pharmacy to verify  estrogen dose. Patient continues to require inpatient psychiatric hospitalization for medication management and stabilization due to risk of self injury.   Major Depressive Disorder, recurrent, severe, without psychotic features Attention-Deficit/Hyperactivity Disorder, unspecified (by history) Bulimia Nervosa (by history) Alcohol Use Disorder, severe Cannabis Use Disorder, mild Personal history of self-harm       Psychiatric Medications Lexapro  was started at 5 mg increased to 10 mg today Continue Abilify  2 mg for adjunctive treatment.  Discontinue propranolol  10 mg PO BID PRN  CIWA protocol with Ativan  prn   -- The risks/benefits/side-effects/alternatives to this medication were discussed in detail with the patient and time was given for questions. The patient consents to medication trial.                -- Metabolic profile and EKG monitoring obtained while on an atypical antipsychotic (BMI: Lipid Panel: HbgA1c: QTc:)              -- Encouraged patient to participate in unit milieu and in scheduled group therapies                            3. Medical Issues Being Addressed: Pharmacy verifying hormone dose.       4. Discharge Planning:   -- Social work and case management to assist with discharge planning and identification of hospital follow-up needs prior to discharge  -- Estimated LOS: 5-7 days  Donnice FORBES Right, PA-C 11/18/2023, 9:14 AM

## 2023-11-18 NOTE — Group Note (Signed)
 LCSW Group Therapy Note   Group Date: 11/18/2023 Start Time: 1300 End Time: 1400   Type of Therapy and Topic:  Group Therapy: Challenging Core Beliefs  Participation Level:  Minimal  Description of Group:  Patients were educated about core beliefs and asked to identify one harmful core belief that they have. Patients were asked to explore from where those beliefs originate. Patients were asked to discuss how those beliefs make them feel and the resulting behaviors of those beliefs. They were then be asked if those beliefs are true and, if so, what evidence they have to support them. Lastly, group members were challenged to replace those negative core beliefs with helpful beliefs.   Therapeutic Goals:   1. Patient will identify harmful core beliefs and explore the origins of such beliefs. 2. Patient will identify feelings and behaviors that result from those core beliefs. 3. Patient will discuss whether such beliefs are true. 4.  Patient will replace harmful core beliefs with helpful ones.  Summary of Patient Progress:  Patient minimally engaged in processing and exploring how core beliefs are formed and how they impact thoughts, feelings, and behaviors. Patient proved open to input from peers and feedback from CSW. Patient demonstrated basic insight into the subject matter, was respectful and supportive of peers, and participated throughout the entire session.  Therapeutic Modalities: Cognitive Behavioral Therapy; Solution-Focused Therapy   Brandin Dilday M Janera Peugh, ISRAEL 11/18/2023  3:07 PM

## 2023-11-18 NOTE — BHH Counselor (Signed)
 CSW received call from [facility] care coordinator, Nat [phone]). She inquired if pt would benefits from care coordination after discharge. CSW stated that it would be beneficial. CSW was informed that pt could be given care coordinators contact information. No other concerns expressed. Contact ended without incident.   Nadara SAUNDERS. Chaim, MSW, LCSW, LCAS 11/18/2023 4:22 PM

## 2023-11-18 NOTE — Plan of Care (Signed)

## 2023-11-18 NOTE — Progress Notes (Signed)
   11/18/23 0830  Psych Admission Type (Psych Patients Only)  Admission Status Involuntary  Psychosocial Assessment  Patient Complaints Anxiety;Depression;Sleep disturbance (patient reports that the doors slamming kept her up last night; depression is about the same, it's always been like that, and her anxiety went down.)  Eye Contact Fair  Facial Expression Flat  Affect Sullen  Speech Soft;Other (Comment) (patient stutters at times)  Interaction Assertive  Motor Activity Slow  Appearance/Hygiene Layered clothes;In scrubs  Behavior Characteristics Cooperative;Appropriate to situation  Mood Pleasant  Aggressive Behavior  Effect No apparent injury  Thought Process  Coherency WDL  Content WDL  Delusions None reported or observed  Perception WDL  Hallucination None reported or observed  Judgment WDL  Confusion None  Danger to Self  Current suicidal ideation? Denies  Self-Injurious Behavior No self-injurious ideation or behavior indicators observed or expressed   Agreement Not to Harm Self Yes  Description of Agreement Verbal  Danger to Others  Danger to Others None reported or observed   Patient's goal for today, per her self-inventory is to be more positive, in which she will be social in order to achieve her goal. Patient has been more active on the unit today and has been engaged in unit groups.

## 2023-11-18 NOTE — Progress Notes (Signed)
 Nursing Shift Note:  1900-0700  Attended Evening Group: yes Medication Compliant:  n/a Behavior: cooperative/ calm Sleep Quality:  Significant Changes:    11/18/23 2100  Psychosocial Assessment  Patient Complaints Anxiety  Eye Contact Fair  Facial Expression Flat  Affect Sad  Speech Soft  Interaction Assertive  Motor Activity Slow  Appearance/Hygiene In scrubs  Behavior Characteristics Cooperative  Mood Pleasant  Aggressive Behavior  Effect No apparent injury  Thought Process  Coherency WDL  Content WDL  Delusions None reported or observed  Perception WDL  Hallucination None reported or observed  Judgment WDL  Confusion None  Danger to Self  Current suicidal ideation? Denies  Danger to Others  Danger to Others None reported or observed

## 2023-11-18 NOTE — Group Note (Signed)
 Date:  11/18/2023 Time:  1:05 PM  Group Topic/Focus:  Building Self Esteem:   The Focus of this group is helping patients become aware of the effects of self-esteem on their lives, the things they and others do that enhance or undermine their self-esteem, seeing the relationship between their level of self-esteem and the choices they make and learning ways to enhance self-esteem.    Participation Level:  Active  Participation Quality:  Appropriate  Affect:  Appropriate  Cognitive:  Alert  Insight: Appropriate  Engagement in Group:  Engaged  Modes of Intervention:  Activity, Discussion, and Education  Additional Comments:    Skippy LITTIE Bennett 11/18/2023, 1:05 PM

## 2023-11-18 NOTE — Group Note (Signed)
 Date:  11/18/2023 Time:  9:00 PM  Group Topic/Focus:  Goals Group:   The focus of this group is to help patients establish daily goals to achieve during treatment and discuss how the patient can incorporate goal setting into their daily lives to aide in recovery.    Participation Level:  Active  Participation Quality:  Appropriate  Affect:  Appropriate  Cognitive:  Appropriate  Insight: Appropriate  Engagement in Group:  Engaged  Modes of Intervention:  Activity  Additional Comments:    Jesus Wilson 11/18/2023, 9:00 PM

## 2023-11-18 NOTE — BH IP Treatment Plan (Signed)
 Interdisciplinary Treatment and Diagnostic Plan Update  11/18/2023 Time of Session: 1400 Jesus Wilson MRN: [id number]  Principal Diagnosis: Suicidal ideations  Secondary Diagnoses: Principal Problem:   Suicidal ideations   Current Medications:  Current Facility-Administered Medications  Medication Dose Route Frequency Provider Last Rate Last Admin   acetaminophen  (TYLENOL ) tablet 650 mg  650 mg Oral Q6H PRN Weber, Kyra A, NP       alum & mag hydroxide-simeth (MAALOX/MYLANTA) 200-200-20 MG/5ML suspension 30 mL  30 mL Oral Q4H PRN Weber, Kyra A, NP   30 mL at 11/13/23 1727   ARIPiprazole  (ABILIFY ) tablet 2 mg  2 mg Oral Daily Millington, Matthew E, PA-C   2 mg at 11/18/23 9187   haloperidol  (HALDOL ) tablet 5 mg  5 mg Oral TID PRN Weber, Kyra A, NP       And   diphenhydrAMINE  (BENADRYL ) capsule 50 mg  50 mg Oral TID PRN Weber, Kyra A, NP       haloperidol  lactate (HALDOL ) injection 5 mg  5 mg Intramuscular TID PRN Weber, Kyra A, NP       And   diphenhydrAMINE  (BENADRYL ) injection 50 mg  50 mg Intramuscular TID PRN Weber, Kyra A, NP       And   LORazepam  (ATIVAN ) injection 2 mg  2 mg Intramuscular TID PRN Weber, Kyra A, NP       haloperidol  lactate (HALDOL ) injection 10 mg  10 mg Intramuscular TID PRN Weber, Kyra A, NP       And   diphenhydrAMINE  (BENADRYL ) injection 50 mg  50 mg Intramuscular TID PRN Weber, Kyra A, NP       And   LORazepam  (ATIVAN ) injection 2 mg  2 mg Intramuscular TID PRN Weber, Kyra A, NP       escitalopram  (LEXAPRO ) tablet 10 mg  10 mg Oral Daily Millington, Matthew E, PA-C   10 mg at 11/18/23 9187   estradiol  valerate (DELESTROGEN ) 20 MG/ML injection 6 mg  6 mg Intramuscular Weekly Millington, Matthew E, PA-C   6 mg at 11/18/23 1433   hydrOXYzine  (ATARAX ) tablet 25 mg  25 mg Oral Q6H PRN Millington, Matthew E, PA-C   25 mg at 11/18/23 1431   magnesium  hydroxide (MILK OF MAGNESIA) suspension 30 mL  30 mL Oral Daily PRN Weber, Kyra A, NP       ondansetron   (ZOFRAN -ODT) disintegrating tablet 4 mg  4 mg Oral Q8H PRN Belue, Nathan S, RPH   4 mg at 11/13/23 2156   progesterone  (PROMETRIUM ) capsule 200 mg  200 mg Oral Daily Weber, Kyra A, NP   200 mg at 11/18/23 9187   spironolactone  (ALDACTONE ) tablet 100 mg  100 mg Oral Daily Zouev, Dmitri, MD   100 mg at 11/18/23 9187   thiamine  (VITAMIN B1) tablet 100 mg  100 mg Oral Daily Weber, Kyra A, NP   100 mg at 11/18/23 9187   Or   thiamine  (VITAMIN B1) injection 100 mg  100 mg Intravenous Daily Weber, Kyra A, NP       white petrolatum  (VASELINE) gel   Topical PRN Millington, Matthew E, PA-C   1 Application at 11/17/23 1347   PTA Medications: Medications Prior to Admission  Medication Sig Dispense Refill Last Dose/Taking   amoxicillin -clavulanate (AUGMENTIN ) 875-125 MG tablet Take 1 tablet by mouth every 12 (twelve) hours. (Patient not taking: Reported on 11/12/2023) 20 tablet 0 Completed Course   estradiol  valerate (DELESTROGEN ) 20 MG/ML injection INJECT 0.3 MILLILITERS INTO THIGH EVERY WEEK  AS DIRECTED BY DOCTOR 10 mL 2    lidocaine  (XYLOCAINE ) 2 % solution Use as directed 15 mLs in the mouth or throat as needed for mouth pain. (Patient not taking: Reported on 11/12/2023) 100 mL 0 Completed Course   progesterone  (PROMETRIUM ) 200 MG capsule Take 1 capsule (200 mg total) by mouth daily. 90 capsule 0    spironolactone  (ALDACTONE ) 100 MG tablet TAKE 1 TABLET BY MOUTH DAILY 90 tablet 0     Patient Stressors: Occupational concerns   Substance abuse    Patient Strengths: Ability for insight  Capable of independent living  Communication skills  Motivation for treatment/growth  Work skills   Treatment Modalities: Medication Management, Group therapy, Case management,  1 to 1 session with clinician, Psychoeducation, Recreational therapy.   Physician Treatment Plan for Primary Diagnosis: Suicidal ideations Long Term Goal(s): Improvement in symptoms so as ready for discharge   Short Term Goals: Ability to  identify changes in lifestyle to reduce recurrence of condition will improve  Medication Management: Evaluate patient's response, side effects, and tolerance of medication regimen.  Therapeutic Interventions: 1 to 1 sessions, Unit Group sessions and Medication administration.  Evaluation of Outcomes: Progressing  Physician Treatment Plan for Secondary Diagnosis: Principal Problem:   Suicidal ideations  Long Term Goal(s): Improvement in symptoms so as ready for discharge   Short Term Goals: Ability to identify changes in lifestyle to reduce recurrence of condition will improve     Medication Management: Evaluate patient's response, side effects, and tolerance of medication regimen.  Therapeutic Interventions: 1 to 1 sessions, Unit Group sessions and Medication administration.  Evaluation of Outcomes: Progressing   RN Treatment Plan for Primary Diagnosis: Suicidal ideations Long Term Goal(s): Knowledge of disease and therapeutic regimen to maintain health will improve  Short Term Goals: Ability to remain free from injury will improve, Ability to verbalize frustration and anger appropriately will improve, Ability to demonstrate self-control, Ability to participate in decision making will improve, Ability to verbalize feelings will improve, Ability to disclose and discuss suicidal ideas, Ability to identify and develop effective coping behaviors will improve, and Compliance with prescribed medications will improve  Medication Management: RN will administer medications as ordered by provider, will assess and evaluate patient's response and provide education to patient for prescribed medication. RN will report any adverse and/or side effects to prescribing provider.  Therapeutic Interventions: 1 on 1 counseling sessions, Psychoeducation, Medication administration, Evaluate responses to treatment, Monitor vital signs and CBGs as ordered, Perform/monitor CIWA, COWS, AIMS and Fall Risk screenings  as ordered, Perform wound care treatments as ordered.  Evaluation of Outcomes: Progressing   LCSW Treatment Plan for Primary Diagnosis: Suicidal ideations Long Term Goal(s): Safe transition to appropriate next level of care at discharge, Engage patient in therapeutic group addressing interpersonal concerns.  Short Term Goals: Engage patient in aftercare planning with referrals and resources, Increase social support, Increase ability to appropriately verbalize feelings, Increase emotional regulation, Facilitate acceptance of mental health diagnosis and concerns, Identify triggers associated with mental health/substance abuse issues, and Increase skills for wellness and recovery  Therapeutic Interventions: Assess for all discharge needs, 1 to 1 time with Social worker, Explore available resources and support systems, Assess for adequacy in community support network, Educate family and significant other(s) on suicide prevention, Complete Psychosocial Assessment, Interpersonal group therapy.  Evaluation of Outcomes: Progressing   Progress in Treatment: Attending groups: Yes. Participating in groups: Yes. Taking medication as prescribed: Yes. Toleration medication: Yes. Family/Significant other contact made: Yes, individual(s) contacted:  mother, Maclovio Henson. Patient understands diagnosis: No. Discussing patient identified problems/goals with staff: Yes. Medical problems stabilized or resolved: Yes. Denies suicidal/homicidal ideation: Yes. Issues/concerns per patient self-inventory: No. Other: none.  New problem(s) identified: No, Describe:  None Update 11/18/23: No changes at this time.    New Short Term/Long Term Goal(s):detox, elimination of symptoms of psychosis, medication management for mood stabilization; elimination of SI thoughts; development of comprehensive mental wellness/sobriety plan. Update 11/18/23: No changes at this time.    Patient Goals:  I just want to feel happy. I  want to feel there's a point to waking up. Update 11/18/23: No changes at this time.   Discharge Plan or Barriers: CSW to assist with the development of appropriate discharge plan. Update 11/18/23: Pt plans to return home with in-person outpatient mental health services.    Reason for Continuation of Hospitalization: Anxiety Depression Suicidal ideation   Estimated Length of Stay: 1-7 days. Update 11/18/23: No changes at this time.  Last 3 [place] Suicide Severity Risk Score: Flowsheet Row Admission (Current) from 11/12/2023 in [facility] INPATIENT BEHAVIORAL MEDICINE ED from 11/11/2023 in [facility] Emergency Department at [facility] ED from 05/16/2023 in [facility] Emergency Department at [facility]  C-SSRS RISK CATEGORY High Risk High Risk No Risk    Last PHQ 2/9 Scores:    05/21/2023    1:57 PM 10/08/2022   10:56 AM 09/25/2021   11:03 AM  Depression screen PHQ 2/9  Decreased Interest 1 1 0  Down, Depressed, Hopeless 1 1 0  PHQ - 2 Score 2 2 0  Altered sleeping 3 3 0  Tired, decreased energy 3 2 0  Change in appetite 3 2 0  Feeling bad or failure about yourself  1 1 2   Trouble concentrating 3 0 0  Moving slowly or fidgety/restless 0 1 0  Suicidal thoughts 0 0 0  PHQ-9 Score 15 11 2   Difficult doing work/chores   Somewhat difficult    Scribe for Treatment Team: Nadara JONELLE Fam, LCSW 11/18/2023 4:11 PM

## 2023-11-19 DIAGNOSIS — R45851 Suicidal ideations: Secondary | ICD-10-CM | POA: Diagnosis not present

## 2023-11-19 NOTE — Progress Notes (Signed)
   11/19/23 1600  Psych Admission Type (Psych Patients Only)  Admission Status Involuntary  Psychosocial Assessment  Patient Complaints Anxiety;Depression;Crying spells (patient states that she initially woke up agitated, which is why she was tearful this morning; depression is getting better and anxiety has gone down quite a bit from when I first got here.)  Eye Contact Fair  Facial Expression Flat  Affect Sullen  Speech Soft;Other (Comment) (patient stutters at times)  Interaction Assertive  Motor Activity Slow  Appearance/Hygiene Layered clothes;In scrubs  Behavior Characteristics Cooperative;Appropriate to situation  Mood Pleasant  Aggressive Behavior  Effect No apparent injury  Thought Process  Coherency WDL  Content WDL  Delusions None reported or observed  Perception WDL  Hallucination None reported or observed  Judgment WDL  Confusion None  Danger to Self  Current suicidal ideation? Denies  Self-Injurious Behavior No self-injurious ideation or behavior indicators observed or expressed   Agreement Not to Harm Self Yes  Description of Agreement Verbal  Danger to Others  Danger to Others None reported or observed   Patient's goal for today, per her self-inventory is to get better in which she will keep going to meetings in order to achieve her goal.

## 2023-11-19 NOTE — Progress Notes (Signed)
 Pt calm and pleasant during assessment denying SI/HI/AVH. Pt stated she happy that she is discharging tomorrow. Pt compliant with medication administration per MD orders. Pt given education, support, and encouragement to be active in her treatment plan. Pt being monitored Q 15 minutes for safety per unit protocol remains safe on the unit

## 2023-11-19 NOTE — Group Note (Signed)
 Recreation Therapy Group Note   Group Topic:General Recreation  Group Date: 11/19/2023 Start Time: 1000 End Time: 1100 Facilitators: Celestia Jeoffrey BRAVO, LRT, CTRS Location: Courtyard  Group Description: [employer]. LRT and patients played games of basketball, drew with chalk, and played corn hole while outside in the courtyard while getting fresh air and sunlight. Music was being played in the background. LRT and peers conversed about different games they have played before, what they do in their free time and anything else that is on their minds. LRT encouraged pts to drink water after being outside, sweating and getting their heart rate up.  Goal Area(s) Addressed: Patient will build on frustration tolerance skills. Patients will partake in a competitive play game with peers. Patients will gain knowledge of new leisure interest/hobby.    Affect/Mood: Appropriate   Participation Level: Active   Participation Quality: Independent   Behavior: Cooperative   Speech/Thought Process: Coherent   Insight: Fair   Judgement: Fair    Modes of Intervention: Activity   Patient Response to Interventions:  Receptive   Education Outcome:  Acknowledges education   Clinical Observations/Individualized Feedback: Sharyne was active in their participation of session activities and group discussion. Pt interacted well with LRT and peers duration of session.    Plan: Continue to engage patient in RT group sessions 2-3x/week.   Jeoffrey BRAVO Celestia, LRT, CTRS 11/19/2023 11:50 AM

## 2023-11-19 NOTE — Group Note (Signed)
 Recreation Therapy Group Note   Group Topic:Healthy Support Systems  Group Date: 11/19/2023 Start Time: 1530 End Time: 1615 Facilitators: Celestia Jeoffrey FORBES ARTICE, CTRS Location: Craft Room  Group Description: Straw Bridge. In groups or individually, patients were given 10 plastic drinking straws and an equal length of masking tape. Using the materials provided, patients were instructed to build a free-standing bridge-like structure to suspend an everyday item (ex: deck of cards) off the floor or table surface. All materials were required to be used in Secondary school teacher. LRT facilitated post-activity discussion reviewing the importance of having strong and healthy support systems in our lives. LRT discussed how the people in our lives serve as the tape and the deck of cards we placed on top of our straw structure are the stressors we face in daily life. LRT and pts discussed what happens in our life when things get too heavy for us , and we don't have strong supports outside of the hospital. Pt shared 2 of their healthy supports in their life aloud in the group.   Goal Area(s) Addressed:  Patient will identify 2 healthy supports in their life. Patient will identify skills to successfully complete activity. Patient will identify correlation of this activity to life post-discharge.  Patient will build on frustration tolerance skills. Patient will increase team building and communication skills.   Affect/Mood: Appropriate   Participation Level: Active and Engaged   Participation Quality: Independent   Behavior: Appropriate, Calm, and Cooperative   Speech/Thought Process: Coherent   Insight: Good   Judgement: Good   Modes of Intervention: STEM Activity   Patient Response to Interventions:  Attentive, Engaged, Interested , and Receptive   Education Outcome:  Acknowledges education   Clinical Observations/Individualized Feedback: Sharyne was active in their participation of session activities  and group discussion. Pt identified my mom and my room as healthy supports.   Plan: Continue to engage patient in RT group sessions 2-3x/week.   Jeoffrey FORBES Celestia, LRT, CTRS 11/19/2023 5:39 PM

## 2023-11-19 NOTE — Group Note (Signed)
 LCSW Group Therapy Note   Group Date: 11/19/2023 Start Time: 0900 End Time: 1000   Type of Therapy and Topic:  Group Therapy: Boundaries  Participation Level:  None  Description of Group: This group will address the use of boundaries in their personal lives. Patients will explore why boundaries are important, the difference between healthy and unhealthy boundaries, and negative and postive outcomes of different boundaries and will look at how boundaries can be crossed.  Patients will be encouraged to identify current boundaries in their own lives and identify what kind of boundary is being set. Facilitators will guide patients in utilizing problem-solving interventions to address and correct types boundaries being used and to address when no boundary is being used. Understanding and applying boundaries will be explored and addressed for obtaining and maintaining a balanced life. Patients will be encouraged to explore ways to assertively make their boundaries and needs known to significant others in their lives, using other group members and facilitator for role play, support, and feedback.  Therapeutic Goals:  1.  Patient will identify areas in their life where setting clear boundaries could be  used to improve their life.  2.  Patient will identify signs/triggers that a boundary is not being respected. 3.  Patient will identify two ways to set boundaries in order to achieve balance in  their lives: 4.  Patient will demonstrate ability to communicate their needs and set boundaries  through discussion and/or role plays  Summary of Patient Progress:   Patient was present in group.  Patient did not engage in group discussion, though appeared attentive.   Therapeutic Modalities:   Cognitive Behavioral Therapy Solution-Focused Therapy  Sherryle JINNY Margo, LCSW 11/19/2023  10:35 AM

## 2023-11-19 NOTE — Progress Notes (Signed)
 [facility] MD Progress Note  11/19/2023 3:31 PM Jesus Wilson  MRN:  [id number]  Jesus Wilson is a 33 yo transgender male to male who presents to this setting following ED appearance being brought in by law enforcement related to concern for SI. Patient has past psychiatric history of ADHD and bulimia and past medical history of stuttering.    Per Chart Review Urine drug screen was positive for tetrahydrocannabinol. Blood alcohol level was elevated (>76) at time of admission. CMP was unremarkable except for BUN 23 and calcium 8.4. EKG performed on 11/11/23 noted QTc 422.   Subjective:  Chart reviewed, case discussed in multidisciplinary meeting, patient seen during rounds.   8/14: On follow-up patient is alert and oriented.  They are pleasant and cooperative on exam.  They deny adverse effects of medications.  They deny SI, HI, and AVH.  They voiced no concerns or complaints at this time.  They request discharge.  They have been attending groups and has been participating in the unit milieu.  They are linear logical and future oriented.  They indicate they have supportive family and plan to go home and live with their mother.  They demonstrate good insight into the need for outpatient follow-up and continued medication compliance.  Relies as needed hydroxyzine  and found effective.  No behavioral PRNs.  8/13: Patient seen for follow up. They are alert and oriented. They are pleasant and cooperative on exam. They are linear, logical, and future oriented. They are medication compliant and are attending group sessions. Felt they were able to learn coping skills yesterday at group and are working on letting go of the past and being more future oriented. States they feel like they are getting better. Wants to engage with therapy when they discharge. Deny SI/HI/AVH. Some drowsiness with the abilify  but report that it is improving.   8/12: Patient is doing well today. Affect is brighter, they were outside and have been  going to group.  They deny SI, HI, and AVH.  They are linear logical and future oriented.  Demonstrate good insight into the need for outpatient follow-up and continued medication compliance.  Indicate they felt heard by this provider.  They deny adverse effects of the medications.  They are agreeable with continuing current medication regimen.  They are transitioning and reports they are doing their estrogen shot tomorrow.  Discussed that if the family ever can bring to the hospital we can get it cleared by pharmacy they were agreeable with this and plan to call their mother who plan to come during visitation tonight.  Nursing staff has been briefed.  They voiced no concerns or complaint at this time.  They utilize as needed hydroxyzine  yesterday and today and found helpful.  No med adjustments.  No withdrawal symptoms.    8/11: Patient seen today for follow-up psychiatric evaluation. She reports her depression remains high. Continues to isolate to room. Feels that no one is helping them here is frustrated about the group sessions. Notes the time that they felt best in life was the first 6 months the started to transition in 2021. Continued to note anxiety. Is forward thinking and wants to start therapy as an outpatient, request that follow up be in person. Continues to endorse passive SI. Discussed increasing Lexapro  and starting low dose Abilify . The risks/benefits/side-effects/alternatives to this medication were discussed in detail with the patient and time was given for questions. The patient consents to medication trial.   8/10 Patient seen today for follow-up psychiatric  evaluation. She reports her depression remains high. She states she has been isolating to her room and does not feel like eating. Endorses passive suicidal ideation without plan or intent. Reports anxiety with restlessness and palpitations. Denies auditory or visual hallucinations, paranoia, or delusions. Denies homicidal  ideation.   11/14/23: Patient seen today for follow-up psychiatric evaluation. She remains in bed and appears disheveled. She reports significant depression rated 7/10 and anxiety rated 4/10. Denies suicidal ideation, homicidal ideation, auditory or visual hallucinations, paranoia, or delusions. Reports no self-harm or aggressive behaviors since last assessment. States she is tolerating the initiation of Lexapro  5 mg without side effects. She notes this is her first trial of Lexapro  for depressive symptoms. Reports sleep good and appetite good.    Past Psychiatric History: see h&P Family History: History reviewed. No pertinent family history. Social History:  Social History   Substance and Sexual Activity  Alcohol Use Yes   Comment: Patient reports 3/4ths of a 5th of liquor a day     Social History   Substance and Sexual Activity  Drug Use Yes   Types: Marijuana    Social History   Socioeconomic History   Marital status: Single    Spouse name: Not on file   Number of children: Not on file   Years of education: Not on file   Highest education level: 12th grade  Occupational History   Not on file  Tobacco Use   Smoking status: Former    Types: Cigarettes   Smokeless tobacco: Not on file   Tobacco comments:    Vaping only; no longer smoking cigarettes  Vaping Use   Vaping status: Former  Substance and Sexual Activity   Alcohol use: Yes    Comment: Patient reports 3/4ths of a 5th of liquor a day   Drug use: Yes    Types: Marijuana   Sexual activity: Not Currently  Other Topics Concern   Not on file  Social History Narrative   Not on file   Social Drivers of Health   Financial Resource Strain: High Risk (10/04/2022)   Overall Financial Resource Strain (CARDIA)    Difficulty of Paying Living Expenses: Hard  Food Insecurity: No Food Insecurity (11/12/2023)   Hunger Vital Sign    Worried About Running Out of Food in the Last Year: Never true    Ran Out of Food in the  Last Year: Never true  Transportation Needs: Unmet Transportation Needs (11/12/2023)   PRAPARE - Transportation    Lack of Transportation (Medical): Yes    Lack of Transportation (Non-Medical): Yes  Physical Activity: Sufficiently Active (10/04/2022)   Exercise Vital Sign    Days of Exercise per Week: 4 days    Minutes of Exercise per Session: 70 min  Stress: Stress Concern Present (10/04/2022)   [employer] of Occupational Health - Occupational Stress Questionnaire    Feeling of Stress : Rather much  Social Connections: Socially Isolated (10/04/2022)   Social Connection and Isolation Panel    Frequency of Communication with Friends and Family: Once a week    Frequency of Social Gatherings with Friends and Family: Once a week    Attends Religious Services: Never    Database administrator or Organizations: No    Attends Engineer, structural: Not on file    Marital Status: Never married   Past Medical History:  Past Medical History:  Diagnosis Date   Seizures (HCC)    Stutter     Past Surgical  History:  Procedure Laterality Date   NO PAST SURGERIES      Current Medications: Current Facility-Administered Medications  Medication Dose Route Frequency Provider Last Rate Last Admin   acetaminophen  (TYLENOL ) tablet 650 mg  650 mg Oral Q6H PRN Weber, Kyra A, NP       alum & mag hydroxide-simeth (MAALOX/MYLANTA) 200-200-20 MG/5ML suspension 30 mL  30 mL Oral Q4H PRN Weber, Kyra A, NP   30 mL at 11/13/23 1727   ARIPiprazole  (ABILIFY ) tablet 2 mg  2 mg Oral Daily Correll Denbow E, PA-C   2 mg at 11/19/23 9180   haloperidol  (HALDOL ) tablet 5 mg  5 mg Oral TID PRN Weber, Kyra A, NP       And   diphenhydrAMINE  (BENADRYL ) capsule 50 mg  50 mg Oral TID PRN Weber, Kyra A, NP       haloperidol  lactate (HALDOL ) injection 5 mg  5 mg Intramuscular TID PRN Weber, Kyra A, NP       And   diphenhydrAMINE  (BENADRYL ) injection 50 mg  50 mg Intramuscular TID PRN Weber, Kyra A, NP        And   LORazepam  (ATIVAN ) injection 2 mg  2 mg Intramuscular TID PRN Weber, Kyra A, NP       haloperidol  lactate (HALDOL ) injection 10 mg  10 mg Intramuscular TID PRN Weber, Kyra A, NP       And   diphenhydrAMINE  (BENADRYL ) injection 50 mg  50 mg Intramuscular TID PRN Weber, Kyra A, NP       And   LORazepam  (ATIVAN ) injection 2 mg  2 mg Intramuscular TID PRN Weber, Kyra A, NP       escitalopram  (LEXAPRO ) tablet 10 mg  10 mg Oral Daily Larrissa Stivers E, PA-C   10 mg at 11/19/23 0818   estradiol  valerate (DELESTROGEN ) 20 MG/ML injection 6 mg  6 mg Intramuscular Weekly Mckinnon Glick E, PA-C   6 mg at 11/18/23 1433   hydrOXYzine  (ATARAX ) tablet 25 mg  25 mg Oral Q6H PRN Laiana Fratus E, PA-C   25 mg at 11/19/23 1218   magnesium  hydroxide (MILK OF MAGNESIA) suspension 30 mL  30 mL Oral Daily PRN Weber, Kyra A, NP       ondansetron  (ZOFRAN -ODT) disintegrating tablet 4 mg  4 mg Oral Q8H PRN Belue, Nathan S, RPH   4 mg at 11/13/23 2156   progesterone  (PROMETRIUM ) capsule 200 mg  200 mg Oral Daily Weber, Kyra A, NP   200 mg at 11/19/23 9180   spironolactone  (ALDACTONE ) tablet 100 mg  100 mg Oral Daily Zouev, Dmitri, MD   100 mg at 11/19/23 9180   thiamine  (VITAMIN B1) tablet 100 mg  100 mg Oral Daily Weber, Kyra A, NP   100 mg at 11/19/23 0818   Or   thiamine  (VITAMIN B1) injection 100 mg  100 mg Intravenous Daily Weber, Kyra A, NP       white petrolatum  (VASELINE) gel   Topical PRN Cass Vandermeulen E, PA-C   1 Application at 11/17/23 1347    Lab Results:  No results found for this or any previous visit (from the past 48 hours).   Blood Alcohol level:  Lab Results  Component Value Date   ETH 276 (H) 11/11/2023    Metabolic Disorder Labs: Lab Results  Component Value Date   HGBA1C 4.9 11/16/2023   MPG 94 11/16/2023   No results found for: PROLACTIN Lab Results  Component Value Date  CHOL 126 11/16/2023   TRIG 61 11/16/2023   HDL 44 11/16/2023   CHOLHDL 2.9  11/16/2023   VLDL 12 11/16/2023   LDLCALC 70 11/16/2023      Psychiatric Specialty Exam: Appearance: Flat affect, dysphoric mood, avoidant eye contact Speech: Normal rate, tone, and volume Thought Process: Linear, goal-directed Thought Content: No delusions, hallucinations, or paranoia observed/reported Mood: Dysphoric Affect: Flat Insight: Fair Judgment: Guarded Orientation: Fully oriented to person, place, time, and situation Memory: Intact Concentration: Adequate for conversation Recall: Intact Fund of Knowledge: Appropriate for education level Language: Fluent, appropriate Psychomotor: No abnormal movements noted Sleep: Not addressed today Appetite: Decreased Suicidal Thoughts: Passive SI without plan/intent Homicidal Thoughts: Denies  Musculoskeletal: Strength & Muscle Tone: within normal limits Gait & Station: normal    Physical Exam: Physical Exam Vitals and nursing note reviewed.  HENT:     Head: Atraumatic.  Eyes:     Extraocular Movements: Extraocular movements intact.  Pulmonary:     Effort: Pulmonary effort is normal.  Neurological:     Mental Status: She is alert and oriented to person, place, and time.    Review of Systems  Psychiatric/Behavioral:  Positive for depression. Negative for hallucinations and suicidal ideas. The patient is nervous/anxious. The patient does not have insomnia.    Blood pressure (!) 136/91, pulse 79, temperature (!) 97.2 F (36.2 C), resp. rate 18, height 5' 5 (1.651 m), weight 64 kg, SpO2 100%. Body mass index is 23.46 kg/m.  Diagnosis: Principal Problem:   Suicidal ideations   PLAN: Safety and Monitoring:  -- Voluntary admission to inpatient psychiatric unit for safety, stabilization and treatment  -- Daily contact with patient to assess and evaluate symptoms and progress in treatment  -- Patient's case to be discussed in multi-disciplinary team meeting  -- Observation Level : q15 minute checks  -- Vital signs:   q12 hours  -- Precautions: suicide, elopement, and assault -- Encouraged patient to participate in unit milieu and in scheduled group therapies  2. Psychiatric Diagnoses and Treatment:   They have been without SI for 3 days prior to that they reported passive SI.  They denied SI on H&P.  They are linear logical and future oriented.  There deny SI, HI, and AVH.  They are requesting discharge.  They do not continue to meet IVC criteria.  They have supportive family.  Social work talk to their mother this morning who is agreeable with coming and picking patient up tomorrow at discharge.  Will discharge patient in the a.m. with appropriate follow-up scheduled.  Psychiatric Medications Lexapro  10 mg daily Continue Abilify  2 mg for adjunctive treatment.  Discontinue propranolol  10 mg PO BID PRN    -- The risks/benefits/side-effects/alternatives to this medication were discussed in detail with the patient and time was given for questions. The patient consents to medication trial.                -- Metabolic profile and EKG monitoring obtained while on an atypical antipsychotic (BMI: Lipid Panel: HbgA1c: QTc:)              -- Encouraged patient to participate in unit milieu and in scheduled group therapies                            3. Medical Issues Being Addressed: Pharmacy verifying hormone dose.       4. Discharge Planning:   -- Social work and case management to assist  with discharge planning and identification of hospital follow-up needs prior to discharge  -- Estimated LOS: 5-7 days  Donnice FORBES Right, PA-C 11/19/2023, 3:31 PM

## 2023-11-19 NOTE — Group Note (Signed)
 Date:  11/19/2023 Time:  2:50 PM  Group Topic/Focus:  Early Warning Signs:   The focus of this group is to help patients identify signs or symptoms they exhibit before slipping into an unhealthy state or crisis. Identifying Needs:   The focus of this group is to help patients identify their personal needs that have been historically problematic and identify healthy behaviors to address their needs.    Participation Level:  Active  Participation Quality:  Appropriate  Affect:  Appropriate  Cognitive:  Appropriate  Insight: Appropriate  Engagement in Group:  Engaged  Modes of Intervention:  Exploration and Socialization  Additional Comments:    Deitra Caron Mainland 11/19/2023, 2:50 PM

## 2023-11-19 NOTE — [facility] Counselor (Signed)
 CSW met with pt briefly to discuss aftercare/discharge plans. Pt reported plans to return home and that she has transportation. Pt has clothes to wear home upon discharge. She denied any use of tobacco products or need for cessation services. Pt did endorse some use of alcohol and cannabis but denied any need for substance use specific treatment. Pt and CSW discussed aftercare. [facility] was discussed. Pt and CSW contacted [facility] and set up aftercare appointment. No other concerns expressed. Contact ended without incident.   Nadara SAUNDERS. Chaim, MSW, LCSW, LCAS 11/19/2023 11:13 AM

## 2023-11-19 NOTE — Group Note (Signed)
 Date:  11/19/2023 Time:  8:54 PM  Group Topic/Focus:  Personal Choices and Values:   The focus of this group is to help patients assess and explore the importance of values in their lives, how their values affect their decisions, how they express their values and what opposes their expression. Self Esteem Action Plan:   The focus of this group is to help patients create a plan to continue to build self-esteem after discharge.    Participation Level:  Active  Participation Quality:  Appropriate  Affect:  Appropriate  Cognitive:  Appropriate  Insight: Appropriate  Engagement in Group:  Engaged  Modes of Intervention:  Discussion  Additional Comments:    Dealie Koelzer L 11/19/2023, 8:54 PM

## 2023-11-19 NOTE — Plan of Care (Signed)
   Problem: Education: Goal: Knowledge of [facility] Education information/materials will improve Outcome: Progressing   Problem: Activity: Goal: Interest or engagement in activities will improve Outcome: Progressing   Problem: Coping: Goal: Ability to demonstrate self-control will improve Outcome: Progressing   Problem: Health Behavior/Discharge Planning: Goal: Compliance with treatment plan for underlying cause of condition will improve Outcome: Progressing   Problem: Physical Regulation: Goal: Ability to maintain clinical measurements within normal limits will improve Outcome: Progressing   Problem: Safety: Goal: Periods of time without injury will increase Outcome: Progressing

## 2023-11-19 NOTE — Group Note (Signed)
 Date:  11/19/2023 Time:  2:44 PM  Group Topic/Focus:  Goals Group:   The focus of this group is to help patients establish daily goals to achieve during treatment and discuss how the patient can incorporate goal setting into their daily lives to aide in recovery.    Participation Level:  Active  Participation Quality:  Appropriate  Affect:  Appropriate  Cognitive:  Appropriate  Insight: Appropriate  Engagement in Group:  Engaged  Modes of Intervention:  Discussion, Education, and Support  Additional Comments:    Deitra Caron Mainland 11/19/2023, 2:44 PM

## 2023-11-19 NOTE — Plan of Care (Signed)

## 2023-11-20 ENCOUNTER — Other Ambulatory Visit: Payer: Self-pay | Admitting: Family Medicine

## 2023-11-20 DIAGNOSIS — R45851 Suicidal ideations: Secondary | ICD-10-CM | POA: Diagnosis not present

## 2023-11-20 MED ORDER — ARIPIPRAZOLE 2 MG PO TABS: 2.0000 mg | ORAL_TABLET | Freq: Every day | ORAL | 0 refills | Status: AC

## 2023-11-20 MED ORDER — HYDROXYZINE HCL 25 MG PO TABS: 25.0000 mg | ORAL_TABLET | Freq: Two times a day (BID) | ORAL | 0 refills | Status: AC | PRN

## 2023-11-20 MED ORDER — ESCITALOPRAM OXALATE 10 MG PO TABS: 10.0000 mg | ORAL_TABLET | Freq: Every day | ORAL | 0 refills | Status: AC

## 2023-11-20 NOTE — Discharge Summary (Signed)
 Physician Discharge Summary Note  Patient:  Jesus Wilson is an 33 y.o., adult MRN:  [id number] DOB:  Jun 28, 1990 Patient phone:  [phone] (home)  Patient address:   [address] [place] [address],   Total time spent: 40 min Date of Admission:  11/12/2023 Date of Discharge: 11/20/2023  Reason for Admission:  The patient is a 33 year old transgender male with a past psychiatric history of ADHD and bulimia and past medical history of stuttering who presented to the emergency department following law enforcement involvement due to concern for suicidal ideation reported by her mother. On presentation, the patient was noted to have an elevated blood alcohol level (>76) and a urine drug screen positive for tetrahydrocannabinol. She endorsed longstanding symptoms of depression including sadness, anhedonia, hopelessness, irritability, isolation, low motivation, and poor sleep in the context of ongoing work and home stressors. Although she denied active suicidal intent at admission, she described chronic sadness and a history of cutting behavior beginning at age 18 to relieve stress. She had no prior psychiatric hospitalizations, outpatient treatment, or medication trials. She denied hallucinations, paranoia, delusions, or trauma history. Given the severity of her depressive symptoms, passive suicidal ideation, history of self-harm, and substance use, she was admitted voluntarily for psychiatric stabilization, initiation of medication management, and engagement with therapeutic services.  Principal Problem: Suicidal ideations Discharge Diagnoses: Principal Problem:   Suicidal ideations   Past Psychiatric History: see h and p   Family Psychiatric  History: see h and p Social History:  Social History   Substance and Sexual Activity  Alcohol Use Yes   Comment: Patient reports 3/4ths of a 5th of liquor a day     Social History   Substance and Sexual Activity  Drug Use Yes   Types:  Marijuana    Social History   Socioeconomic History   Marital status: Single    Spouse name: Not on file   Number of children: Not on file   Years of education: Not on file   Highest education level: 12th grade  Occupational History   Not on file  Tobacco Use   Smoking status: Former    Types: Cigarettes   Smokeless tobacco: Not on file   Tobacco comments:    Vaping only; no longer smoking cigarettes  Vaping Use   Vaping status: Former  Substance and Sexual Activity   Alcohol use: Yes    Comment: Patient reports 3/4ths of a 5th of liquor a day   Drug use: Yes    Types: Marijuana   Sexual activity: Not Currently  Other Topics Concern   Not on file  Social History Narrative   Not on file   Social Drivers of Health   Financial Resource Strain: High Risk (10/04/2022)   Overall Financial Resource Strain (CARDIA)    Difficulty of Paying Living Expenses: Hard  Food Insecurity: No Food Insecurity (11/12/2023)   Hunger Vital Sign    Worried About Running Out of Food in the Last Year: Never true    Ran Out of Food in the Last Year: Never true  Transportation Needs: Unmet Transportation Needs (11/12/2023)   PRAPARE - Transportation    Lack of Transportation (Medical): Yes    Lack of Transportation (Non-Medical): Yes  Physical Activity: Sufficiently Active (10/04/2022)   Exercise Vital Sign    Days of Exercise per Week: 4 days    Minutes of Exercise per Session: 70 min  Stress: Stress Concern Present (10/04/2022)   [employer] of Occupational Health - Occupational  Stress Questionnaire    Feeling of Stress : Rather much  Social Connections: Socially Isolated (10/04/2022)   Social Connection and Isolation Panel    Frequency of Communication with Friends and Family: Once a week    Frequency of Social Gatherings with Friends and Family: Once a week    Attends Religious Services: Never    Database administrator or Organizations: No    Attends Engineer, structural: Not  on file    Marital Status: Never married   Past Medical History:  Past Medical History:  Diagnosis Date   Seizures (HCC)    Stutter     Past Surgical History:  Procedure Laterality Date   NO PAST SURGERIES     Family History: History reviewed. No pertinent family history.  Hospital Course:     During admission, the patient was started on Lexapro  5 mg daily, later titrated to 10 mg daily, and Abilify  2 mg daily was added for augmentation of mood. Gender affirming medications were reordered at home doses. CIWA protocol was initiated with Ativan  PRN due to elevated alcohol level on admission, though no significant withdrawal symptoms were noted during hospitalization. Hydroxyzine  was prescribed PRN for anxiety with good effect. The risks, benefits, and alternatives of all medications were discussed in detail, and the patient consented to treatment. EKG and metabolic monitoring were obtained and reviewed while on an atypical antipsychotic. She was encouraged to participate in the unit milieu and scheduled group therapies.  Early in admission, the patient presented as withdrawn, isolative, and significantly depressed, with passive suicidal ideation but no active intent or plan. Over subsequent days, she became brighter in affect, more interactive, and more engaged in groups. She reported learning coping strategies, expressed motivation to pursue outpatient therapy, and demonstrated good insight into her need for ongoing psychiatric treatment. She voiced satisfaction with her medication regimen, reported only mild and improving drowsiness from Abilify , and denied adverse effects otherwise. She consistently denied suicidal ideation, homicidal ideation, and hallucinations in the latter half of her stay.  By the time of discharge, the patient was pleasant, cooperative, linear, logical, and future-oriented. She was compliant with medications, actively attending group sessions, and utilizing coping  skills. She identified her mother as a strong support system and planned to live with her following discharge. She denied SI, HI, or AVH and demonstrated improved mood, motivation, and readiness to transition to outpatient care.  A detailed risk assessment is complete based on clinical exam and individual risk factors and acute suicide risk is low and acute violence risk is low. Currently, all modifiable risk of harm to self/harm to others have been addressed and the patient is no longer appropriate for the acute inpatient setting and is able to continue treatment for mental health needs in the community with the supports as indicated below. Patient is educated and verbalized understanding of discharge plan of care including medications, follow-up appointments, mental health resources and further crisis services in the community. She is instructed to call 911 or present to the nearest emergency room should she experience any decompensation in mood, or return of suicidal/homicidal ideations. Patient verbalizes understanding of this education and agrees to this plan of care.   Physical Findings: AIMS:  , ,  ,  ,    CIWA:  CIWA-Ar Total: 1 COWS:        Psychiatric Specialty Exam:  Presentation  General Appearance:  Casual  Eye Contact: Fair  Speech: Clear and Coherent  Speech Volume: Normal  Mood and Affect  Mood: Euthymic  Affect: Congruent   Thought Process  Thought Processes: Coherent  Descriptions of Associations:Intact  Orientation:Full (Time, Place and Person)  Thought Content:WDL  Hallucinations:Hallucinations: None  Ideas of Reference:None  Suicidal Thoughts:Suicidal Thoughts: No  Homicidal Thoughts:Homicidal Thoughts: No   Sensorium  Memory: Immediate Fair; Recent Fair  Judgment: Good  Insight: Good   Executive Functions  Concentration:No data recorded Attention Span:No data recorded Recall:No data recorded Fund of Knowledge:No data  recorded Language:No data recorded  Psychomotor Activity  Psychomotor Activity: Psychomotor Activity: Normal  Musculoskeletal: Strength & Muscle Tone: within normal limits Gait & Station: normal Assets  Assets: Manufacturing systems engineer; Desire for Improvement   Sleep  Sleep: Sleep: Good    Physical Exam: Physical Exam Vitals and nursing note reviewed.  HENT:     Head: Atraumatic.  Eyes:     Extraocular Movements: Extraocular movements intact.  Pulmonary:     Effort: Pulmonary effort is normal.  Neurological:     Mental Status: She is alert and oriented to person, place, and time.    Review of Systems  Psychiatric/Behavioral:  Negative for hallucinations, memory loss, substance abuse and suicidal ideas. The patient is nervous/anxious. The patient does not have insomnia.    Blood pressure 114/74, pulse 83, temperature (!) 97.3 F (36.3 C), resp. rate 20, height 5' 5 (1.651 m), weight 62.6 kg, SpO2 100%. Body mass index is 22.96 kg/m.   Social History   Tobacco Use  Smoking Status Former   Types: Cigarettes  Smokeless Tobacco Not on file  Tobacco Comments   Vaping only; no longer smoking cigarettes   Tobacco Cessation:  A prescription for an FDA-approved tobacco cessation medication was offered at discharge and the patient refused   Blood Alcohol level:  Lab Results  Component Value Date   ETH 276 (H) 11/11/2023    Metabolic Disorder Labs:  Lab Results  Component Value Date   HGBA1C 4.9 11/16/2023   MPG 94 11/16/2023   No results found for: PROLACTIN Lab Results  Component Value Date   CHOL 126 11/16/2023   TRIG 61 11/16/2023   HDL 44 11/16/2023   CHOLHDL 2.9 11/16/2023   VLDL 12 11/16/2023   LDLCALC 70 11/16/2023    See Psychiatric Specialty Exam and Suicide Risk Assessment completed by Attending Physician prior to discharge.  Discharge destination:  Home  Is patient on multiple antipsychotic therapies at discharge:  No   Has Patient had  three or more failed trials of antipsychotic monotherapy by history:  No  Recommended Plan for Multiple Antipsychotic Therapies: NA   Allergies as of 11/20/2023   No Known Allergies      Medication List     STOP taking these medications    amoxicillin -clavulanate 875-125 MG tablet Commonly known as: AUGMENTIN    lidocaine  2 % solution Commonly known as: XYLOCAINE        TAKE these medications      Indication  ARIPiprazole  2 MG tablet Commonly known as: ABILIFY  Take 1 tablet (2 mg total) by mouth daily.  Indication: Major Depressive Disorder   escitalopram  10 MG tablet Commonly known as: LEXAPRO  Take 1 tablet (10 mg total) by mouth daily.  Indication: Generalized Anxiety Disorder, Major Depressive Disorder   hydrOXYzine  25 MG tablet Commonly known as: ATARAX  Take 1 tablet (25 mg total) by mouth 2 (two) times daily as needed for anxiety.  Indication: Feeling Anxious   progesterone  200 MG capsule Commonly known as: PROMETRIUM  Take 1 capsule (200 mg  total) by mouth daily.  Indication: transgender male   spironolactone  100 MG tablet Commonly known as: ALDACTONE  TAKE 1 TABLET BY MOUTH DAILY  Indication: Transgender Woman        Follow-up Information     [facility] Follow up.   Why: Your appointment is scheduled for Thursday, 12/03/23 at 2PM with Waddell Lever, NP. Contact information: [address] [place] [address] [phone]                 Follow-up recommendations:    # It is recommended to the patient to continue psychiatric medications as prescribed, after discharge from the hospital.   # It is recommended to the patient to follow up with your outpatient psychiatric provider and PCP. # It was discussed with the patient, the impact of alcohol, drugs, tobacco have been there overall psychiatric and medical wellbeing, and total abstinence from substance use was recommended. # Prescriptions provided or sent  directly to preferred pharmacy at discharge. Patient agreeable to plan. Given the opportunity to ask questions. Appears to feel comfortable with discharge.  # In the event of worsening symptoms, the patient is instructed to call the crisis hotline (988), 911 and or go to the nearest ED for appropriate evaluation and treatment of symptoms. To follow-up with primary care provider for other medical issues, concerns and or health care needs # Patient was discharged home as requested with a plan to follow up as noted above.      Signed: Donnice FORBES Right, PA-C 11/21/2023, 4:45 PM

## 2023-11-20 NOTE — Progress Notes (Signed)
 Patient denies SI/I/AVH at this time. Discharge instructions, AVS, prescriptions, and transition record reviewed with patient. Patient agrees to comply with medication management, follow-up visit and outpatient therapy. Patient belongings returned to patient. Patient questions and concerns addressed and answered.  Patient ambulatory off unit. Patient discharged home with mom.

## 2023-11-20 NOTE — Plan of Care (Signed)
   Problem: Education: Goal: Emotional status will improve Outcome: Progressing Goal: Mental status will improve Outcome: Progressing

## 2023-11-20 NOTE — Progress Notes (Signed)
   11/20/23 1000  Psych Admission Type (Psych Patients Only)  Admission Status Involuntary  Psychosocial Assessment  Patient Complaints Anxiety;Depression;Other (Comment) (Patient reports excitement at being discharged today.)  Eye Contact Fair  Facial Expression Animated  Affect Sad;Appropriate to circumstance  Speech Logical/coherent;Soft;Other (Comment) (Patient has a stutter at baseline)  Interaction Assertive  Motor Activity Other (Comment) (appropriate for developmental age)  Appearance/Hygiene Unremarkable  Behavior Characteristics Cooperative  Mood Pleasant;Sad (Patient writes her goal for today is going home.)  Thought Process  Coherency WDL  Content WDL  Delusions None reported or observed  Perception WDL  Hallucination None reported or observed  Judgment WDL  Confusion None  Danger to Self  Current suicidal ideation? Denies  Self-Injurious Behavior No self-injurious ideation or behavior indicators observed or expressed   Danger to Others  Danger to Others None reported or observed

## 2023-11-20 NOTE — BHH Suicide Risk Assessment (Signed)
 [facility] Discharge Suicide Risk Assessment   Principal Problem: Suicidal ideations Discharge Diagnoses: Principal Problem:   Suicidal ideations   Total Time spent with patient: 1 hour  Musculoskeletal: Strength & Muscle Tone: within normal limits Gait & Station: normal Patient leans: N/A  Psychiatric Specialty Exam  Presentation  General Appearance:  Casual  Eye Contact: Fair  Speech: Clear and Coherent  Speech Volume: Normal  Handedness:No data recorded  Mood and Affect  Mood: Euthymic  Duration of Depression Symptoms: No data recorded Affect: Congruent   Thought Process  Thought Processes: Coherent  Descriptions of Associations:Intact  Orientation:Full (Time, Place and Person)  Thought Content:WDL  History of Schizophrenia/Schizoaffective disorder:No data recorded Duration of Psychotic Symptoms:No data recorded Hallucinations:Hallucinations: None  Ideas of Reference:None  Suicidal Thoughts:Suicidal Thoughts: No  Homicidal Thoughts:Homicidal Thoughts: No   Sensorium  Memory: Immediate Fair; Recent Fair  Judgment: Good  Insight: Good   Executive Functions  Concentration:No data recorded Attention Span:No data recorded Recall:No data recorded Fund of Knowledge:No data recorded Language:No data recorded  Psychomotor Activity  Psychomotor Activity: Psychomotor Activity: Normal   Assets  Assets: Manufacturing systems engineer; Desire for Improvement   Sleep  Sleep: Sleep: Good  Estimated Sleeping Duration (Last 24 Hours): 7.00-8.25 hours  Physical Exam: Physical Exam Vitals and nursing note reviewed.  HENT:     Head: Atraumatic.  Eyes:     Extraocular Movements: Extraocular movements intact.  Pulmonary:     Effort: Pulmonary effort is normal.  Neurological:     Mental Status: She is alert and oriented to person, place, and time.  Psychiatric:        Mood and Affect: Mood normal.        Thought Content: Thought content normal.         Judgment: Judgment normal.    Review of Systems  Psychiatric/Behavioral:  Negative for hallucinations, substance abuse and suicidal ideas. The patient is nervous/anxious. The patient does not have insomnia.    Blood pressure 114/74, pulse 83, temperature (!) 97.3 F (36.3 C), resp. rate 20, height 5' 5 (1.651 m), weight 62.6 kg, SpO2 100%. Body mass index is 22.96 kg/m.  Mental Status Per Nursing Assessment::   On Admission:  Self-harm thoughts  Demographic Factors:  Gay, lesbian, or bisexual orientation  Loss Factors: NA  Historical Factors: Impulsivity  Risk Reduction Factors:   Living with another person, especially a relative, Positive social support, and Positive coping skills or problem solving skills  Continued Clinical Symptoms:  Previous Psychiatric Diagnoses and Treatments Medical Diagnoses and Treatments/Surgeries  Cognitive Features That Contribute To Risk:  None    Suicide Risk:  Minimal: No identifiable suicidal ideation.  Patients presenting with no risk factors but with morbid ruminations; may be classified as minimal risk based on the severity of the depressive symptoms   Follow-up Information     [facility] Follow up.   Why: Your appointment is scheduled for Thursday, 12/03/23 at 2PM with Waddell Lever, NP. Contact information: [address] [place] [address] [phone]                 Plan Of Care/Follow-up recommendations:  # It is recommended to the patient to continue psychiatric medications as prescribed, after discharge from the hospital.   # It is recommended to the patient to follow up with your outpatient psychiatric provider and PCP. # It was discussed with the patient, the impact of alcohol, drugs, tobacco have been there overall psychiatric and medical wellbeing, and  total abstinence from substance use was recommended. # Prescriptions provided or sent directly to preferred pharmacy at  discharge. Patient agreeable to plan. Given the opportunity to ask questions. Appears to feel comfortable with discharge.  # In the event of worsening symptoms, the patient is instructed to call the crisis hotline (988), 911 and or go to the nearest ED for appropriate evaluation and treatment of symptoms. To follow-up with primary care provider for other medical issues, concerns and or health care needs # Patient was discharged home as requested with a plan to follow up as noted above.    Donnice FORBES Right, PA-C 11/20/2023, 12:04 PM

## 2023-11-20 NOTE — Plan of Care (Signed)

## 2023-11-20 NOTE — Group Note (Signed)
 Recreation Therapy Group Note   Group Topic:Health and Wellness  Group Date: 11/20/2023 Start Time: 1020 End Time: 1120 Facilitators: Celestia Jeoffrey BRAVO, LRT, CTRS Location: Courtyard  Group Description: [employer]. LRT and patients played games of basketball, drew with chalk, and played corn hole while outside in the courtyard while getting fresh air and sunlight. Music was being played in the background. LRT and peers conversed about different games they have played before, what they do in their free time and anything else that is on their minds. LRT encouraged pts to drink water after being outside, sweating and getting their heart rate up.  Goal Area(s) Addressed: Patient will build on frustration tolerance skills. Patients will partake in a competitive play game with peers. Patients will gain knowledge of new leisure interest/hobby.    Affect/Mood: Appropriate   Participation Level: Active   Participation Quality: Independent   Behavior: Appropriate   Speech/Thought Process: Coherent   Insight: Fair   Judgement: Fair    Modes of Intervention: Activity   Patient Response to Interventions:  Receptive   Education Outcome:  Acknowledges education   Clinical Observations/Individualized Feedback: Jesus Wilson was active in their participation of session activities and group discussion. Pt interacted well with LRT and peers duration of session.    Plan: Continue to engage patient in RT group sessions 2-3x/week.   Jeoffrey BRAVO Celestia, LRT, CTRS 11/20/2023 11:32 AM

## 2023-11-20 NOTE — Group Note (Signed)
 Date:  11/20/2023 Time:  10:36 AM  Group Topic/Focus:  Goals Group:   The focus of this group is to help patients establish daily goals to achieve during treatment and discuss how the patient can incorporate goal setting into their daily lives to aide in recovery.    Participation Level:  Active  Participation Quality:  Appropriate  Affect:  Appropriate  Cognitive:  Appropriate  Insight: Appropriate  Engagement in Group:  Engaged  Modes of Intervention:  Discussion, Education, and Support  Additional Comments:    Deitra Caron Mainland 11/20/2023, 10:36 AM

## 2023-11-20 NOTE — Progress Notes (Signed)
  [facility] Adult Case Management Discharge Plan :  Will you be returning to the same living situation after discharge:  Yes,  pt plans to return home upon discharge.  At discharge, do you have transportation home?: Yes,  pt mother providing transportation. Do you have the ability to pay for your medications: Yes,  [place] MEDICAID PREPAID HEALTH PLAN / [place] MEDICAID HEALTHY BLUE  Release of information consent forms completed and in the chart;  Patient's signature needed at discharge.  Patient to Follow up at:  Follow-up Information     [facility] Follow up.   Why: Your appointment is scheduled for Thursday, 12/03/23 at 2PM with Waddell Lever, NP. Contact information: [address] [place] [address] [phone]                 Next level of care provider has access to [facility] Link:no  Safety Planning and Suicide Prevention discussed: Yes,  SPE completed with mother, Dhruv Christina.      Has patient been referred to the Quitline?: Patient refused referral for treatment  Patient has been referred for addiction treatment: No known substance use disorder.  Nadara JONELLE Fam, LCSW 11/20/2023, 9:50 AM

## 2023-12-23 ENCOUNTER — Telehealth: Payer: Self-pay

## 2023-12-23 NOTE — Telephone Encounter (Signed)
 Mother of patient called stating that a medication could not be filled until the provider spoke to the pharmacy she did not state which medication called number listed for the patient voicemail not set up called number listed for mother no answer left voicemail patient was admitted in the hospital not sure who prescribed the aripiprazole  she stated that she has been to this office I don't see a visit in her chart or any upcoming visits in the chart

## 2024-01-04 ENCOUNTER — Other Ambulatory Visit: Payer: Self-pay | Admitting: Family Medicine

## 2024-02-03 ENCOUNTER — Ambulatory Visit: Admitting: Family Medicine

## 2024-03-18 ENCOUNTER — Other Ambulatory Visit: Payer: Self-pay | Admitting: Family Medicine

## 2024-04-03 ENCOUNTER — Other Ambulatory Visit: Payer: Self-pay | Admitting: Family Medicine
